# Patient Record
Sex: Female | Born: 1950 | Race: Black or African American | Hispanic: No | Smoking: Never smoker
Health system: Southern US, Community
[De-identification: ages and names within clinical notes are randomized; demographics above are authoritative.]

## PROBLEM LIST (undated history)

## (undated) DIAGNOSIS — I10 Essential (primary) hypertension: Secondary | ICD-10-CM

## (undated) DIAGNOSIS — E119 Type 2 diabetes mellitus without complications: Secondary | ICD-10-CM

## (undated) HISTORY — DX: Type 2 diabetes mellitus without complications: E11.9

## (undated) HISTORY — DX: Essential (primary) hypertension: I10

## (undated) HISTORY — PX: BACK SURGERY: SHX140

---

## 1997-05-14 DIAGNOSIS — E1129 Type 2 diabetes mellitus with other diabetic kidney complication: Secondary | ICD-10-CM | POA: Insufficient documentation

## 2004-02-23 ENCOUNTER — Other Ambulatory Visit: Admission: RE | Admit: 2004-02-23 | Discharge: 2004-02-23 | Payer: Self-pay | Admitting: Internal Medicine

## 2004-02-23 ENCOUNTER — Encounter: Payer: Self-pay | Admitting: Internal Medicine

## 2004-02-23 LAB — CONVERTED CEMR LAB: Pap Smear: NORMAL

## 2004-03-23 ENCOUNTER — Ambulatory Visit: Payer: Self-pay | Admitting: Internal Medicine

## 2004-07-18 ENCOUNTER — Ambulatory Visit: Payer: Self-pay

## 2004-09-07 ENCOUNTER — Ambulatory Visit: Payer: Self-pay | Admitting: Unknown Physician Specialty

## 2004-10-23 ENCOUNTER — Emergency Department: Payer: Self-pay | Admitting: Emergency Medicine

## 2004-10-24 ENCOUNTER — Emergency Department: Payer: Self-pay | Admitting: Emergency Medicine

## 2005-11-02 ENCOUNTER — Ambulatory Visit: Payer: Self-pay | Admitting: Internal Medicine

## 2007-01-01 ENCOUNTER — Encounter: Payer: Self-pay | Admitting: Internal Medicine

## 2007-01-01 DIAGNOSIS — I10 Essential (primary) hypertension: Secondary | ICD-10-CM | POA: Insufficient documentation

## 2011-12-20 ENCOUNTER — Emergency Department: Payer: Self-pay | Admitting: Emergency Medicine

## 2011-12-20 LAB — URINALYSIS, COMPLETE
Bacteria: NONE SEEN
Glucose,UR: NEGATIVE mg/dL (ref 0–75)
Nitrite: NEGATIVE
Specific Gravity: 1.021 (ref 1.003–1.030)
WBC UR: 2 /HPF (ref 0–5)

## 2014-05-25 DIAGNOSIS — E119 Type 2 diabetes mellitus without complications: Secondary | ICD-10-CM | POA: Insufficient documentation

## 2016-04-04 DIAGNOSIS — Z6841 Body Mass Index (BMI) 40.0 and over, adult: Secondary | ICD-10-CM | POA: Insufficient documentation

## 2017-05-27 ENCOUNTER — Other Ambulatory Visit: Payer: Self-pay | Admitting: Family Medicine

## 2017-05-27 DIAGNOSIS — Z0000 Encounter for general adult medical examination without abnormal findings: Secondary | ICD-10-CM

## 2017-05-27 DIAGNOSIS — Z13820 Encounter for screening for osteoporosis: Secondary | ICD-10-CM

## 2017-06-26 ENCOUNTER — Encounter: Payer: Self-pay | Admitting: Radiology

## 2017-06-26 ENCOUNTER — Ambulatory Visit
Admission: RE | Admit: 2017-06-26 | Discharge: 2017-06-26 | Disposition: A | Discharge status: Home / Self Care | Payer: Medicare PPO | Source: Ambulatory Visit | Attending: Family Medicine | Admitting: Family Medicine

## 2017-06-26 DIAGNOSIS — Z13820 Encounter for screening for osteoporosis: Secondary | ICD-10-CM | POA: Insufficient documentation

## 2017-06-26 DIAGNOSIS — M8588 Other specified disorders of bone density and structure, other site: Secondary | ICD-10-CM | POA: Diagnosis not present

## 2017-06-26 DIAGNOSIS — Z0000 Encounter for general adult medical examination without abnormal findings: Secondary | ICD-10-CM

## 2018-04-22 ENCOUNTER — Other Ambulatory Visit: Payer: Self-pay | Admitting: Family Medicine

## 2018-04-22 DIAGNOSIS — Z1231 Encounter for screening mammogram for malignant neoplasm of breast: Secondary | ICD-10-CM

## 2018-05-22 ENCOUNTER — Ambulatory Visit
Admission: RE | Admit: 2018-05-22 | Discharge: 2018-05-22 | Disposition: A | Discharge status: Home / Self Care | Payer: Medicare HMO | Source: Ambulatory Visit | Attending: Family Medicine | Admitting: Family Medicine

## 2018-05-22 DIAGNOSIS — Z1231 Encounter for screening mammogram for malignant neoplasm of breast: Secondary | ICD-10-CM | POA: Diagnosis present

## 2019-07-13 ENCOUNTER — Other Ambulatory Visit: Payer: Self-pay | Admitting: Family Medicine

## 2019-07-13 DIAGNOSIS — M8580 Other specified disorders of bone density and structure, unspecified site: Secondary | ICD-10-CM

## 2019-07-31 ENCOUNTER — Ambulatory Visit: Payer: Medicare HMO | Attending: Internal Medicine

## 2019-07-31 DIAGNOSIS — Z23 Encounter for immunization: Secondary | ICD-10-CM

## 2019-07-31 NOTE — Progress Notes (Signed)
   Covid-19 Vaccination Clinic  Name:  Elizabeth Blackwell    MRN: [id number] DOB: 02-16-1951  07/31/2019  [name] was observed post Covid-19 immunization for 15 minutes without incident. She was provided with Vaccine Information Sheet and instruction to access the V-Safe system.   [name] was instructed to call 911 with any severe reactions post vaccine: Marland Kitchen Difficulty breathing  . Swelling of face and throat  . A fast heartbeat  . A bad rash all over body  . Dizziness and weakness   Immunizations Administered    Name Date Dose VIS Date Route   Pfizer COVID-19 Vaccine 07/31/2019  3:31 PM 0.3 mL 04/24/2019 Intramuscular   Manufacturer: [employer], [employer]   Lot: [id number]   NDC: [address]-4

## 2019-08-24 ENCOUNTER — Ambulatory Visit: Payer: Medicare HMO | Attending: Internal Medicine

## 2019-08-25 ENCOUNTER — Ambulatory Visit: Payer: Medicare HMO | Attending: Internal Medicine

## 2019-08-25 DIAGNOSIS — Z23 Encounter for immunization: Secondary | ICD-10-CM

## 2019-08-25 NOTE — Progress Notes (Signed)
   Covid-19 Vaccination Clinic  Name:  Elizabeth Blackwell    MRN: [id number] DOB: 1951/03/17  08/25/2019  [name] was observed post Covid-19 immunization for 15 minutes without incident. She was provided with Vaccine Information Sheet and instruction to access the V-Safe system.   [name] was instructed to call 911 with any severe reactions post vaccine: Marland Kitchen Difficulty breathing  . Swelling of face and throat  . A fast heartbeat  . A bad rash all over body  . Dizziness and weakness   Immunizations Administered    Name Date Dose VIS Date Route   Pfizer COVID-19 Vaccine 08/25/2019 10:36 AM 0.3 mL 04/24/2019 Intramuscular   Manufacturer: [employer], [employer]   Lot: [id number]   NDC: [address]-1

## 2019-11-18 ENCOUNTER — Other Ambulatory Visit: Payer: Self-pay | Admitting: Family Medicine

## 2019-11-18 DIAGNOSIS — Z1231 Encounter for screening mammogram for malignant neoplasm of breast: Secondary | ICD-10-CM

## 2019-12-02 ENCOUNTER — Ambulatory Visit
Admission: RE | Admit: 2019-12-02 | Discharge: 2019-12-02 | Disposition: A | Discharge status: Home / Self Care | Payer: Medicare HMO | Source: Ambulatory Visit | Attending: Family Medicine | Admitting: Family Medicine

## 2019-12-02 DIAGNOSIS — Z1231 Encounter for screening mammogram for malignant neoplasm of breast: Secondary | ICD-10-CM | POA: Insufficient documentation

## 2020-02-01 ENCOUNTER — Ambulatory Visit
Admission: RE | Admit: 2020-02-01 | Discharge: 2020-02-01 | Disposition: A | Discharge status: Home / Self Care | Payer: Medicare HMO | Source: Ambulatory Visit | Attending: Family Medicine | Admitting: Family Medicine

## 2020-02-01 ENCOUNTER — Other Ambulatory Visit: Payer: Self-pay

## 2020-02-01 DIAGNOSIS — Z780 Asymptomatic menopausal state: Secondary | ICD-10-CM | POA: Insufficient documentation

## 2020-02-01 DIAGNOSIS — Z13820 Encounter for screening for osteoporosis: Secondary | ICD-10-CM | POA: Diagnosis not present

## 2020-02-01 DIAGNOSIS — M8580 Other specified disorders of bone density and structure, unspecified site: Secondary | ICD-10-CM

## 2020-04-19 ENCOUNTER — Other Ambulatory Visit: Payer: Self-pay | Admitting: Physician Assistant

## 2020-05-25 ENCOUNTER — Ambulatory Visit
Admission: RE | Admit: 2020-05-25 | Discharge: 2020-05-25 | Disposition: A | Discharge status: Home / Self Care | Payer: Medicare HMO | Source: Ambulatory Visit | Attending: Physician Assistant | Admitting: Physician Assistant

## 2020-05-25 ENCOUNTER — Other Ambulatory Visit: Payer: Self-pay | Admitting: Physician Assistant

## 2020-05-25 ENCOUNTER — Other Ambulatory Visit: Payer: Self-pay

## 2020-05-25 DIAGNOSIS — Z8616 Personal history of COVID-19: Secondary | ICD-10-CM

## 2020-05-25 DIAGNOSIS — J189 Pneumonia, unspecified organism: Secondary | ICD-10-CM | POA: Insufficient documentation

## 2020-05-25 DIAGNOSIS — U099 Post covid-19 condition, unspecified: Secondary | ICD-10-CM | POA: Insufficient documentation

## 2020-09-07 ENCOUNTER — Other Ambulatory Visit: Payer: Self-pay | Admitting: Physician Assistant

## 2020-09-07 DIAGNOSIS — Z1231 Encounter for screening mammogram for malignant neoplasm of breast: Secondary | ICD-10-CM

## 2020-12-05 ENCOUNTER — Other Ambulatory Visit: Payer: Self-pay

## 2020-12-05 ENCOUNTER — Ambulatory Visit
Admission: RE | Admit: 2020-12-05 | Discharge: 2020-12-05 | Disposition: A | Discharge status: Home / Self Care | Payer: Medicare HMO | Source: Ambulatory Visit | Attending: Physician Assistant | Admitting: Physician Assistant

## 2020-12-05 DIAGNOSIS — Z1231 Encounter for screening mammogram for malignant neoplasm of breast: Secondary | ICD-10-CM | POA: Insufficient documentation

## 2021-02-20 LAB — HEMOGLOBIN A1C: Hemoglobin A1C: 7.5

## 2021-05-31 ENCOUNTER — Ambulatory Visit: Payer: Self-pay

## 2021-05-31 NOTE — Telephone Encounter (Signed)
Called and explained to pt that Raynelle Fanning could not send anything in or see her for cough until she is established here as pt. Pt has a NP appt on 06/12/2021

## 2021-05-31 NOTE — Telephone Encounter (Signed)
The patient would like to be contacted about their cough when possible   The patient shares that they've experienced a cough since early January   The cough has caused soreness, nausea and discomfort   Please contact further when possible    Chief Complaint: Productive cough with yellow mucus Symptoms: Cough, wheezing, runny nose  Frequency: Started 05/16/21 Pertinent Negatives: Patient denies fever. Disposition: [] ED /[] Urgent Care (no appt availability in office) / [] Appointment(In office/virtual)/ []  [facility]/ [] Home Care/ [] Refused Recommended Disposition /[] [facility]/ []  Follow-up with PCP Additional Notes: Pt. Has an a new pt. Appointment 06/12/21. Asking of she can be seen sooner. Monday "is good day for me." Please advise pt.   Answer Assessment - Initial Assessment Questions 1. ONSET: "When did the cough begin?"      05/16/21 2. SEVERITY: "How bad is the cough today?"      Severe 3. SPUTUM: "Describe the color of your sputum" (none, dry cough; clear, white, yellow, green)     Yellow 4. HEMOPTYSIS: "Are you coughing up any blood?" If so ask: "How much?" (flecks, streaks, tablespoons, etc.)     No 5. DIFFICULTY BREATHING: "Are you having difficulty breathing?" If Yes, ask: "How bad is it?" (e.g., mild, moderate, severe)    - MILD: No SOB at rest, mild SOB with walking, speaks normally in sentences, can lie down, no retractions, pulse < 100.    - MODERATE: SOB at rest, SOB with minimal exertion and prefers to sit, cannot lie down flat, speaks in phrases, mild retractions, audible wheezing, pulse 100-120.    - SEVERE: Very SOB at rest, speaks in single words, struggling to breathe, sitting hunched forward, retractions, pulse > 120      Mild when coughing 6. FEVER: "Do you have a fever?" If Yes, ask: "What is your temperature, how was it measured, and when did it start?"     No 7. CARDIAC HISTORY: "Do you have any history of heart disease?" (e.g., heart  attack, congestive heart failure)      No 8. LUNG HISTORY: "Do you have any history of lung disease?"  (e.g., pulmonary embolus, asthma, emphysema)     No 9. PE RISK FACTORS: "Do you have a history of blood clots?" (or: recent major surgery, recent prolonged travel, bedridden)     No 10. OTHER SYMPTOMS: "Do you have any other symptoms?" (e.g., runny nose, wheezing, chest pain)       Wheezing, runny nose 11. PREGNANCY: "Is there any chance you are pregnant?" "When was your last menstrual period?"       No 12. TRAVEL: "Have you traveled out of the country in the last month?" (e.g., travel history, exposures)       No  Protocols used: Cough - Acute Productive-A-AH

## 2021-06-12 ENCOUNTER — Ambulatory Visit
Admission: RE | Admit: 2021-06-12 | Discharge: 2021-06-12 | Disposition: A | Discharge status: Home / Self Care | Payer: Medicare HMO | Source: Ambulatory Visit | Attending: Nurse Practitioner | Admitting: Nurse Practitioner

## 2021-06-12 ENCOUNTER — Encounter: Payer: Self-pay | Admitting: Nurse Practitioner

## 2021-06-12 ENCOUNTER — Ambulatory Visit
Admission: RE | Admit: 2021-06-12 | Discharge: 2021-06-12 | Disposition: A | Discharge status: Home / Self Care | Payer: Medicare HMO | Attending: Pediatrics | Admitting: Pediatrics

## 2021-06-12 ENCOUNTER — Other Ambulatory Visit: Payer: Self-pay

## 2021-06-12 ENCOUNTER — Ambulatory Visit (INDEPENDENT_AMBULATORY_CARE_PROVIDER_SITE_OTHER): Payer: Medicare HMO | Admitting: Nurse Practitioner

## 2021-06-12 VITALS — BP 138/86 | HR 97 | Temp 97.7°F | Resp 16 | Ht 60.0 in | Wt 214.2 lb

## 2021-06-12 DIAGNOSIS — R059 Cough, unspecified: Secondary | ICD-10-CM

## 2021-06-12 DIAGNOSIS — Z6841 Body Mass Index (BMI) 40.0 and over, adult: Secondary | ICD-10-CM

## 2021-06-12 DIAGNOSIS — R0602 Shortness of breath: Secondary | ICD-10-CM | POA: Diagnosis present

## 2021-06-12 DIAGNOSIS — Z7689 Persons encountering health services in other specified circumstances: Secondary | ICD-10-CM | POA: Diagnosis not present

## 2021-06-12 DIAGNOSIS — E119 Type 2 diabetes mellitus without complications: Secondary | ICD-10-CM | POA: Diagnosis not present

## 2021-06-12 DIAGNOSIS — I10 Essential (primary) hypertension: Secondary | ICD-10-CM | POA: Diagnosis not present

## 2021-06-12 MED ORDER — ACCU-CHEK AVIVA PLUS VI STRP: ORAL_STRIP | 12 refills | Status: DC

## 2021-06-12 MED ORDER — ALBUTEROL SULFATE HFA 108 (90 BASE) MCG/ACT IN AERS: 2.0000 | INHALATION_SPRAY | Freq: Four times a day (QID) | RESPIRATORY_TRACT | 0 refills | Status: DC | PRN

## 2021-06-12 NOTE — Progress Notes (Signed)
Pulse 97    Temp 97.7 F (36.5 C) (Oral)    Resp 16    Ht 5' (1.524 m)    Wt 214 lb 3.2 oz (97.2 kg)    SpO2 98%    BMI 41.83 kg/m    Subjective:    Patient ID: Elizabeth Blackwell, female    DOB: 1951-03-25, 71 y.o.   MRN: [id number]  HPI: Elizabeth Blackwell is a 71 y.o. female, here alone  Chief Complaint  Patient presents with   Establish Care   Cough    On and off for 1 month    Establish care: She says it has been a long time. She has been getting care at [facility]. She says she recently had blood work, requesting records.   HTN: She does not take her blood pressure at home. Her blood pressure today was  138/86 .  She is currently taking lisinopril 40 mg daily.  She denies any chest pain , headaches or blurred vision.   DM2: She does check her blood sugar every once in awhile.  She is currently taking linagliptin 5 mg daily.  Does not tolerate metformin. Diabetic eye exam done at [facility], two months ago.  Foot exam up to date done at [facility] within the last year will get records. She denies any polyuria, polydipsia, or polyphagia.   Obesity: Her current weight is 214 lbs.  She says it has been hard eating healthy with her cough.  She says the cough drops have a lot of sugar in them.  She says everyone is buying up the sugar free cough drops.   Cough: She said about a month ago she had a URI.  She says she has been coughing since.  She says she went to the doctor she has been taking mucinex, tussinex, tessalon perls and albuterol. She says she has been using cough drops and hot totties. She says that the cough has improved but she still coughing.  She says she did feel feverish about a week ago.  She says that she can get short of breath after a coughing fit. Will get chest xray. She would like a refill on her albuterol inhaler. Also, gave her a armonair digihaler sample to do twice a day.   Relevant past medical, surgical, family and social history  reviewed and updated as indicated. Interim medical history since our last visit reviewed. Allergies and medications reviewed and updated.  Review of Systems  Constitutional: Positive for fever, negative for weight change.  Respiratory: Positive for cough and shortness of breath.   Cardiovascular: Negative for chest pain or palpitations.  Gastrointestinal: Negative for abdominal pain, no bowel changes.  Musculoskeletal: Negative for gait problem or joint swelling.  Skin: Negative for rash.  Neurological: Negative for dizziness or headache.  No other specific complaints in a complete review of systems (except as listed in HPI above).      Objective:    Pulse 97    Temp 97.7 F (36.5 C) (Oral)    Resp 16    Ht 5' (1.524 m)    Wt 214 lb 3.2 oz (97.2 kg)    SpO2 98%    BMI 41.83 kg/m   Wt Readings from Last 3 Encounters:  06/12/21 214 lb 3.2 oz (97.2 kg)    Physical Exam  Constitutional: Patient appears well-developed and well-nourished. Obese  No distress.  HEENT: head atraumatic, normocephalic, pupils equal and reactive to light, neck supple  Cardiovascular: Normal rate, regular rhythm and normal heart sounds.  No murmur heard. No BLE edema. Pulmonary/Chest: Effort normal and breath sounds normal. No respiratory distress. Abdominal: Soft.  There is no tenderness. Psychiatric: Patient has a normal mood and affect. behavior is normal. Judgment and thought content normal.   Results for orders placed or performed in visit on 02/23/04  Converted [facility] Lab  Result Value Ref Range   Pap Smear Normal       Assessment & Plan:   1. Essential hypertension -continue current treatment  2. Controlled type 2 diabetes mellitus without complication, without long-term current use of insulin (HCC)  - glucose blood (ACCU-CHEK AVIVA PLUS) test strip; Use as instructed  Dispense: 100 each; Refill: 12  3. Morbid obesity with BMI of 40.0-44.9, adult (HCC) -increase physical activity as  tolerated -monitor sweets  4. Encounter to establish care -get records from previous provider  5. Cough, unspecified type  - DG Chest 2 View; Future - albuterol (VENTOLIN HFA) 108 (90 Base) MCG/ACT inhaler; Inhale 2 puffs into the lungs every 6 (six) hours as needed for wheezing or shortness of breath.  Dispense: 8 g; Refill: 0  6. Shortness of breath  - DG Chest 2 View; Future - albuterol (VENTOLIN HFA) 108 (90 Base) MCG/ACT inhaler; Inhale 2 puffs into the lungs every 6 (six) hours as needed for wheezing or shortness of breath.  Dispense: 8 g; Refill: 0     Follow up plan: Return in about 6 months (around 12/10/2021) for cpe.

## 2021-06-13 ENCOUNTER — Encounter: Payer: Self-pay | Admitting: Nurse Practitioner

## 2021-06-13 ENCOUNTER — Other Ambulatory Visit: Payer: Self-pay | Admitting: Nurse Practitioner

## 2021-06-14 ENCOUNTER — Telehealth: Payer: Self-pay | Admitting: Nurse Practitioner

## 2021-06-14 DIAGNOSIS — E119 Type 2 diabetes mellitus without complications: Secondary | ICD-10-CM

## 2021-06-14 NOTE — Telephone Encounter (Signed)
[facility] called, phone rings, no answer. Will try back later.

## 2021-06-14 NOTE — Telephone Encounter (Signed)
Copied from CRM [id number]. Topic: Quick Communication - Rx Refill/Question >> Jun 14, 2021 10:30 AM Gaetana Michaelis A wrote: Medication: glucose blood (ACCU-CHEK AVIVA PLUS) test strip [[id number]]   Has the patient contacted their pharmacy? Yes.  The patient has been directed to contact their PCP (Agent: If no, request that the patient contact the pharmacy for the refill. If patient does not wish to contact the pharmacy document the reason why and proceed with request.) (Agent: If yes, when and what did the pharmacy advise?)  Preferred Pharmacy (with phone number or street name): [facility] [address] [place] - [address] [place] [address] Phone: [phone] Fax: [phone]  Has the patient been seen for an appointment in the last year OR does the patient have an upcoming appointment? Yes.    Agent: Please be advised that RX refills may take up to 3 business days. We ask that you follow-up with your pharmacy.

## 2021-06-14 NOTE — Telephone Encounter (Signed)
[facility] called and spoke to [place], [facility] about the refill(s) Aviva Plus Test Strips requested. Advised it was sent on 06/12/21 #100/12 refill(s). She's on Medicare A/B and they need specific directions on how often to check the blood sugar. Advised I will send to the provider.

## 2021-06-15 ENCOUNTER — Other Ambulatory Visit: Payer: Self-pay | Admitting: Emergency Medicine

## 2021-06-15 DIAGNOSIS — E119 Type 2 diabetes mellitus without complications: Secondary | ICD-10-CM

## 2021-06-15 MED ORDER — ACCU-CHEK AVIVA PLUS VI STRP: ORAL_STRIP | 12 refills | Status: DC

## 2021-06-15 NOTE — Telephone Encounter (Signed)
New script sent with Dx code and directions

## 2021-06-20 ENCOUNTER — Other Ambulatory Visit: Payer: Self-pay | Admitting: Nurse Practitioner

## 2021-06-20 ENCOUNTER — Other Ambulatory Visit: Payer: Self-pay | Admitting: Emergency Medicine

## 2021-06-20 ENCOUNTER — Telehealth: Payer: Self-pay

## 2021-06-20 DIAGNOSIS — E119 Type 2 diabetes mellitus without complications: Secondary | ICD-10-CM

## 2021-06-20 DIAGNOSIS — E669 Obesity, unspecified: Secondary | ICD-10-CM | POA: Insufficient documentation

## 2021-06-20 DIAGNOSIS — E1169 Type 2 diabetes mellitus with other specified complication: Secondary | ICD-10-CM | POA: Insufficient documentation

## 2021-06-20 MED ORDER — ACCU-CHEK AVIVA PLUS VI STRP: ORAL_STRIP | 12 refills | Status: DC

## 2021-06-20 NOTE — Telephone Encounter (Signed)
[facility] called to get a new Rx sent with directions/ can not be covered with no specific directions / please advise about glucose blood (ACCU-CHEK AVIVA PLUS) test strip

## 2021-06-20 NOTE — Telephone Encounter (Signed)
Order sent.

## 2021-06-20 NOTE — Telephone Encounter (Signed)
Copied from CRM [id number]. Topic: Quick Communication - Rx Refill/Question >> Jun 20, 2021 10:33 AM Pawlus, Maxine Glenn A wrote: Pt stated her insurance wont pay for her Rx without more detailed instructions, "Use as instructed" is not enough information for her insurance to pay for glucose blood (ACCU-CHEK AVIVA PLUS) test strip, please advise.

## 2021-06-21 ENCOUNTER — Telehealth: Payer: Self-pay

## 2021-06-21 NOTE — Telephone Encounter (Signed)
Copied from CRM [id number]. Topic: General - Other >> Jun 21, 2021 10:49 AM Elizabeth Blackwell wrote: Reason for CRM: The patient has called to request an update on their previous rx issues   The patient has been told by their pharmacy that they need additional directions with their glucose blood (ACCU-CHEK AVIVA PLUS) test strip [[id number]]  prescription so that it can be covered by their insurance  "Use as directed" is not an accepted direction by the patient's insurance and coverage of the test strips is unlikely without clarification   The patient would like to speak with Blackwell member of clinical staff when possible about this matter  Please contact further

## 2021-06-21 NOTE — Telephone Encounter (Signed)
[facility] this morning

## 2021-06-22 ENCOUNTER — Other Ambulatory Visit: Payer: Self-pay | Admitting: Emergency Medicine

## 2021-06-22 DIAGNOSIS — E119 Type 2 diabetes mellitus without complications: Secondary | ICD-10-CM

## 2021-06-22 MED ORDER — ACCU-CHEK AVIVA PLUS VI STRP: ORAL_STRIP | 12 refills | Status: DC

## 2021-07-27 ENCOUNTER — Other Ambulatory Visit: Payer: Self-pay | Admitting: Nurse Practitioner

## 2021-07-27 NOTE — Telephone Encounter (Signed)
Requested medication (s) are due for refill today - unsure ? ?Requested medication (s) are on the active medication list -yes ? ?Future visit scheduled -no ? ?Last refill: 06/12/21 ? ?Notes to clinic: Request RF: Rx for acute problem- request for RF sent for review.  ? ?Requested Prescriptions  ?Pending Prescriptions Disp Refills  ? albuterol (VENTOLIN HFA) 108 (90 Base) MCG/ACT inhaler [Pharmacy Med Name: Albuterol Sulfate HFA 108 (90 Base) MCG/ACT Inhalation Aerosol Solution] 9 g 0  ?  Sig: INHALE 2 PUFFS BY MOUTH EVERY 6 HOURS AS NEEDED FOR WHEEZING AND FOR SHORTNESS OF BREATH  ?  ? Pulmonology:  Beta Agonists 2 Passed - 07/27/2021  1:54 PM  ?  ?  Passed - Last BP in normal range  ?  BP Readings from Last 1 Encounters:  ?06/12/21 138/86  ?  ?  ?  ?  Passed - Last Heart Rate in normal range  ?  Pulse Readings from Last 1 Encounters:  ?06/12/21 97  ?  ?  ?  ?  Passed - Valid encounter within last 12 months  ?  Recent Outpatient Visits   ? ?      ? 1 month ago Essential hypertension  ? [facility] Berniece Salines, FNP  ? ?  ?  ?Future Appointments   ? ?        ? In 4 months Zane Herald, Rudolpho Sevin, FNP [facility]  ? ?  ? ?  ?  ?  ? ? ? ?Requested Prescriptions  ?Pending Prescriptions Disp Refills  ? albuterol (VENTOLIN HFA) 108 (90 Base) MCG/ACT inhaler [Pharmacy Med Name: Albuterol Sulfate HFA 108 (90 Base) MCG/ACT Inhalation Aerosol Solution] 9 g 0  ?  Sig: INHALE 2 PUFFS BY MOUTH EVERY 6 HOURS AS NEEDED FOR WHEEZING AND FOR SHORTNESS OF BREATH  ?  ? Pulmonology:  Beta Agonists 2 Passed - 07/27/2021  1:54 PM  ?  ?  Passed - Last BP in normal range  ?  BP Readings from Last 1 Encounters:  ?06/12/21 138/86  ?  ?  ?  ?  Passed - Last Heart Rate in normal range  ?  Pulse Readings from Last 1 Encounters:  ?06/12/21 97  ?  ?  ?  ?  Passed - Valid encounter within last 12 months  ?  Recent Outpatient Visits   ? ?      ? 1 month ago Essential hypertension  ? [facility] Berniece Salines, FNP  ? ?  ?  ?Future Appointments   ? ?        ? In 4 months Zane Herald, Rudolpho Sevin, FNP [facility]  ? ?  ? ?  ?  ?  ? ? ? ?

## 2021-07-31 ENCOUNTER — Other Ambulatory Visit: Payer: Self-pay

## 2021-07-31 DIAGNOSIS — R0602 Shortness of breath: Secondary | ICD-10-CM

## 2021-07-31 DIAGNOSIS — R059 Cough, unspecified: Secondary | ICD-10-CM

## 2021-08-01 ENCOUNTER — Ambulatory Visit (INDEPENDENT_AMBULATORY_CARE_PROVIDER_SITE_OTHER): Payer: Medicare HMO

## 2021-08-01 ENCOUNTER — Telehealth: Payer: Self-pay

## 2021-08-01 DIAGNOSIS — Z1231 Encounter for screening mammogram for malignant neoplasm of breast: Secondary | ICD-10-CM | POA: Diagnosis not present

## 2021-08-01 DIAGNOSIS — Z780 Asymptomatic menopausal state: Secondary | ICD-10-CM | POA: Diagnosis not present

## 2021-08-01 DIAGNOSIS — Z1211 Encounter for screening for malignant neoplasm of colon: Secondary | ICD-10-CM

## 2021-08-01 DIAGNOSIS — Z0000 Encounter for general adult medical examination without abnormal findings: Secondary | ICD-10-CM

## 2021-08-01 NOTE — Progress Notes (Signed)
? ?Subjective:  ? Elizabeth Blackwell is a 71 y.o. female who presents for an Initial Medicare Annual Wellness Visit. ? ?Virtual Visit via Telephone Note ? ?I connected with  Elizabeth Blackwell on 08/01/21 at  1:30 PM EDT by telephone and verified that I am speaking with the correct person using two identifiers. ? ?Location: ?Patient: home ?Provider: [place] ?Persons participating in the virtual visit: patient/Nurse Health Advisor ?  ?I discussed the limitations, risks, security and privacy concerns of performing an evaluation and management service by telephone and the availability of in person appointments. The patient expressed understanding and agreed to proceed. ? ?Interactive audio and video telecommunications were attempted between this nurse and patient, however failed, due to patient having technical difficulties OR patient did not have access to video capability.  We continued and completed visit with audio only. ? ?Some vital signs may be absent or patient reported.  ? ?Clemetine Marker, LPN ? ? ?Review of Systems    ? ?Cardiac Risk Factors include: advanced age (>28men, >4 women);diabetes mellitus;hypertension ? ?   ?Objective:  ?  ?There were no vitals filed for this visit. ?There is no height or weight on file to calculate BMI. ? ?Advanced Directives 08/01/2021  ?Does Patient Have a Medical Advance Directive? No  ?Would patient like information on creating a medical advance directive? No - Patient declined  ? ? ?Current Medications (verified) ?Outpatient Encounter Medications as of 08/01/2021  ?Medication Sig  ? Accu-Chek Softclix Lancets lancets   ? albuterol (VENTOLIN HFA) 108 (90 Base) MCG/ACT inhaler INHALE 2 PUFFS BY MOUTH EVERY 6 HOURS AS NEEDED FOR WHEEZING AND FOR SHORTNESS OF BREATH  ? glucose blood (ACCU-CHEK AVIVA PLUS) test strip Test Blood sugar twice a day. Dx. E11.69  ? linagliptin (TRADJENTA) 5 MG TABS tablet   ? lisinopril (ZESTRIL) 40 MG tablet Take 40 mg by mouth daily.  ? melatonin 5 MG TABS  Take 10 mg by mouth at bedtime.  ? ?No facility-administered encounter medications on file as of 08/01/2021.  ? ? ?Allergies (verified) ?Codeine phosphate  ? ?History: ?Past Medical History:  ?Diagnosis Date  ? Diabetes mellitus without complication ([place])   ? Hypertension   ? ?Past Surgical History:  ?Procedure Laterality Date  ? BACK SURGERY    ? ?History reviewed. No pertinent family history. ?Social History  ? ?Socioeconomic History  ? Marital status: Divorced  ?  Spouse name: Not on file  ? Number of children: 2  ? Years of education: Not on file  ? Highest education level: Not on file  ?Occupational History  ? Not on file  ?Tobacco Use  ? Smoking status: Never  ? Smokeless tobacco: Never  ?Vaping Use  ? Vaping Use: Never used  ?Substance and Sexual Activity  ? Alcohol use: Yes  ?  Alcohol/week: 1.0 standard drink  ?  Types: 1 Cans of beer per week  ? Drug use: Never  ? Sexual activity: Not Currently  ?Other Topics Concern  ? Not on file  ?Social History Narrative  ? Pt lives alone  ? ?Social Determinants of Health  ? ?Financial Resource Strain: Low Risk   ? Difficulty of Paying Living Expenses: Not hard at all  ?Food Insecurity: No Food Insecurity  ? Worried About Charity fundraiser in the Last Year: Never true  ? Ran Out of Food in the Last Year: Never true  ?Transportation Needs: No Transportation Needs  ? Lack of Transportation (Medical): No  ? Lack of  Transportation (Non-Medical): No  ?Physical Activity: Inactive  ? Days of Exercise per Week: 0 days  ? Minutes of Exercise per Session: 0 min  ?Stress: No Stress Concern Present  ? Feeling of Stress : Not at all  ?Social Connections: Moderately Isolated  ? Frequency of Communication with Friends and Family: More than three times a week  ? Frequency of Social Gatherings with Friends and Family: More than three times a week  ? Attends Religious Services: More than 4 times per year  ? Active Member of Clubs or Organizations: No  ? Attends Archivist  Meetings: Never  ? Marital Status: Divorced  ? ? ?Tobacco Counseling ?Counseling given: Not Answered ? ? ?Clinical Intake: ? ?Pre-visit preparation completed: Yes ? ?Pain : No/denies pain ? ?  ? ?Nutritional Risks: None ?Diabetes: Yes ?CBG done?: No ?Did pt. bring in CBG monitor from home?: No ? ?How often do you need to have someone help you when you read instructions, pamphlets, or other written materials from your doctor or pharmacy?: 1 - Never ? ?Nutrition Risk Assessment: ? ?Has the patient had any N/V/D within the last 2 months?  No  ?Does the patient have any non-healing wounds?  No  ?Has the patient had any unintentional weight loss or weight gain?  No  ? ?Diabetes: ? ?Is the patient diabetic?  Yes  ?If diabetic, was a CBG obtained today?  No  ?Did the patient bring in their glucometer from home?  No  ?How often do you monitor your CBG's? daily.  ? ?Financial Strains and Diabetes Management: ? ?Are you having any financial strains with the device, your supplies or your medication? No .  ?Does the patient want to be seen by Chronic Care Management for management of their diabetes?  No  ?Would the patient like to be referred to a Nutritionist or for Diabetic Management?  No  ? ?Diabetic Exams: ? ?Diabetic Eye Exam: Completed per patient; need records from [facility].  ? ?Diabetic Foot Exam: Pt has been advised about the importance in completing this exam. Pt is scheduled for diabetic foot exam on 12/08/21; pt also sees podiatry.  ? ? ?Interpreter Needed?: No ? ?Information entered by :: Clemetine Marker LPN ? ? ?Activities of Daily Living ?In your present state of health, do you have any difficulty performing the following activities: 08/01/2021 06/12/2021  ?Hearing? N N  ?Vision? N Y  ?Difficulty concentrating or making decisions? N N  ?Walking or climbing stairs? N N  ?Dressing or bathing? N N  ?Doing errands, shopping? N N  ?Preparing Food and eating ? N -  ?Using the Toilet? N -  ?In the past six months,  have you accidently leaked urine? N -  ?Do you have problems with loss of bowel control? N -  ?Managing your Medications? N -  ?Managing your Finances? N -  ?Housekeeping or managing your Housekeeping? N -  ?Some recent data might be hidden  ? ? ?Patient Care Team: ?Bo Merino, FNP as PCP - General (Nurse Practitioner) ?Sharlotte Alamo, DPM (Podiatry) ?Estill Cotta, MD (Ophthalmology) ? ?Indicate any recent Medical Services you may have received from other than [facility] providers in the past year (date may be approximate). ? ?   ?Assessment:  ? This is a routine wellness examination for [place]. ? ?Hearing/Vision screen ?Hearing Screening - Comments:: Pt denies hearing difficulty ? ?Vision Screening - Comments:: Annual vision screenings done at [facility]  ? ?Dietary issues  and exercise activities discussed: ?Current Exercise Habits: The patient does not participate in regular exercise at present, Exercise limited by: None identified ? ? Goals Addressed   ? ?  ?  ?  ?  ? This Visit's Progress  ?  Patient Stated     ?  Pt would like to reduce sugar intake ?  ? ?  ? ?Depression Screen ?PHQ 2/9 Scores 08/01/2021 06/12/2021  ?PHQ - 2 Score 0 0  ?PHQ- 9 Score 1 0  ?  ?Fall Risk ?Fall Risk  08/01/2021 06/12/2021  ?Falls in the past year? 0 0  ?Number falls in past yr: 0 0  ?Injury with Fall? 0 0  ?Risk for fall due to : No Fall Risks -  ?Follow up Falls prevention discussed Falls evaluation completed  ? ? ?FALL RISK PREVENTION PERTAINING TO THE HOME: ? ?Any stairs in or around the home? No  ?If so, are there any without handrails? No  ?Home free of loose throw rugs in walkways, pet beds, electrical cords, etc? Yes  ?Adequate lighting in your home to reduce risk of falls? Yes  ? ?ASSISTIVE DEVICES UTILIZED TO PREVENT FALLS: ? ?Life alert? No  ?Use of a cane, walker or w/c? No  ?Grab bars in the bathroom? Yes  ?Shower chair or bench in shower? No  ?Elevated toilet seat or a handicapped toilet? Yes  ? ?TIMED UP AND  GO: ? ?Was the test performed? No . Telephonic visit ? ?Cognitive Function: Normal cognitive status assessed by direct observation by this Nurse Health Advisor. No abnormalities found.  ? ?  ?  ?  ? ?Im

## 2021-08-01 NOTE — Patient Instructions (Signed)
Elizabeth Blackwell , ?Thank you for taking time to come for your Medicare Wellness Visit. I appreciate your ongoing commitment to your health goals. Please review the following plan we discussed and let me know if I can assist you in the future.  ? ?Screening recommendations/referrals: ?Colonoscopy: Referral sent to [facility] today for repeat screening colonoscopy. They will contact you for an appointment.  ?Mammogram: done 12/05/20. Please call [phone] to schedule your mammogram and bone density screening ?Bone Density: done 02/01/20 ?Recommended yearly ophthalmology/optometry visit for glaucoma screening and checkup ?Recommended yearly dental visit for hygiene and checkup ? ?Vaccinations: ?Influenza vaccine: done 03/08/21 ?Pneumococcal vaccine: due ?Tdap vaccine: due ?Shingles vaccine: Shingrix discussed. Please contact your pharmacy for coverage information.  ?Covid-19: done 07/31/19, 08/25/19 & 06/18/20 ? ?Advanced directives: Please bring a copy of your health care power of attorney and living will to the office at your convenience once you have completed those documents.  ? ?Conditions/risks identified: Recommend healthy eating and reducing sugar intake.  ? ?Next appointment: Follow up in one year for your annual wellness visit  ? ? ?Preventive Care 7 Years and Older, Female ?Preventive care refers to lifestyle choices and visits with your health care provider that can promote health and wellness. ?What does preventive care include? ?A yearly physical exam. This is also called an annual well check. ?Dental exams once or twice a year. ?Routine eye exams. Ask your health care provider how often you should have your eyes checked. ?Personal lifestyle choices, including: ?Daily care of your teeth and gums. ?Regular physical activity. ?Eating a healthy diet. ?Avoiding tobacco and drug use. ?Limiting alcohol use. ?Practicing safe sex. ?Taking low-dose aspirin every day. ?Taking vitamin and mineral  supplements as recommended by your health care provider. ?What happens during an annual well check? ?The services and screenings done by your health care provider during your annual well check will depend on your age, overall health, lifestyle risk factors, and family history of disease. ?Counseling  ?Your health care provider may ask you questions about your: ?Alcohol use. ?Tobacco use. ?Drug use. ?Emotional well-being. ?Home and relationship well-being. ?Sexual activity. ?Eating habits. ?History of falls. ?Memory and ability to understand (cognition). ?Work and work Astronomer. ?Reproductive health. ?Screening  ?You may have the following tests or measurements: ?Height, weight, and BMI. ?Blood pressure. ?Lipid and cholesterol levels. These may be checked every 5 years, or more frequently if you are over 62 years old. ?Skin check. ?Lung cancer screening. You may have this screening every year starting at age 92 if you have a 30-pack-year history of smoking and currently smoke or have quit within the past 15 years. ?Fecal occult blood test (FOBT) of the stool. You may have this test every year starting at age 55. ?Flexible sigmoidoscopy or colonoscopy. You may have a sigmoidoscopy every 5 years or a colonoscopy every 10 years starting at age 39. ?Hepatitis C blood test. ?Hepatitis B blood test. ?Sexually transmitted disease (STD) testing. ?Diabetes screening. This is done by checking your blood sugar (glucose) after you have not eaten for a while (fasting). You may have this done every 1-3 years. ?Bone density scan. This is done to screen for osteoporosis. You may have this done starting at age 60. ?Mammogram. This may be done every 1-2 years. Talk to your health care provider about how often you should have regular mammograms. ?Talk with your health care provider about your test results, treatment options, and if necessary, the need for more tests. ?Vaccines  ?Your health  care provider may recommend certain  vaccines, such as: ?Influenza vaccine. This is recommended every year. ?Tetanus, diphtheria, and acellular pertussis (Tdap, Td) vaccine. You may need a Td booster every 10 years. ?Zoster vaccine. You may need this after age 70. ?Pneumococcal 13-valent conjugate (PCV13) vaccine. One dose is recommended after age 70. ?Pneumococcal polysaccharide (PPSV23) vaccine. One dose is recommended after age 70. ?Talk to your health care provider about which screenings and vaccines you need and how often you need them. ?This information is not intended to replace advice given to you by your health care provider. Make sure you discuss any questions you have with your health care provider. ?Document Released: 05/27/2015 Document Revised: 01/18/2016 Document Reviewed: 03/01/2015 ?Elsevier Interactive Patient Education ? 2017 [employer] ? ?Fall Prevention in the Home ?Falls can cause injuries. They can happen to people of all ages. There are many things you can do to make your home safe and to help prevent falls. ?What can I do on the outside of my home? ?Regularly fix the edges of walkways and driveways and fix any cracks. ?Remove anything that might make you trip as you walk through a door, such as a raised step or threshold. ?Trim any bushes or trees on the path to your home. ?Use bright outdoor lighting. ?Clear any walking paths of anything that might make someone trip, such as rocks or tools. ?Regularly check to see if handrails are loose or broken. Make sure that both sides of any steps have handrails. ?Any raised decks and porches should have guardrails on the edges. ?Have any leaves, snow, or ice cleared regularly. ?Use sand or salt on walking paths during winter. ?Clean up any spills in your garage right away. This includes oil or grease spills. ?What can I do in the bathroom? ?Use night lights. ?Install grab bars by the toilet and in the tub and shower. Do not use towel bars as grab bars. ?Use non-skid mats or decals in  the tub or shower. ?If you need to sit down in the shower, use a plastic, non-slip stool. ?Keep the floor dry. Clean up any water that spills on the floor as soon as it happens. ?Remove soap buildup in the tub or shower regularly. ?Attach bath mats securely with double-sided non-slip rug tape. ?Do not have throw rugs and other things on the floor that can make you trip. ?What can I do in the bedroom? ?Use night lights. ?Make sure that you have a light by your bed that is easy to reach. ?Do not use any sheets or blankets that are too big for your bed. They should not hang down onto the floor. ?Have a firm chair that has side arms. You can use this for support while you get dressed. ?Do not have throw rugs and other things on the floor that can make you trip. ?What can I do in the kitchen? ?Clean up any spills right away. ?Avoid walking on wet floors. ?Keep items that you use a lot in easy-to-reach places. ?If you need to reach something above you, use a strong step stool that has a grab bar. ?Keep electrical cords out of the way. ?Do not use floor polish or wax that makes floors slippery. If you must use wax, use non-skid floor wax. ?Do not have throw rugs and other things on the floor that can make you trip. ?What can I do with my stairs? ?Do not leave any items on the stairs. ?Make sure that there are handrails on  both sides of the stairs and use them. Fix handrails that are broken or loose. Make sure that handrails are as long as the stairways. ?Check any carpeting to make sure that it is firmly attached to the stairs. Fix any carpet that is loose or worn. ?Avoid having throw rugs at the top or bottom of the stairs. If you do have throw rugs, attach them to the floor with carpet tape. ?Make sure that you have a light switch at the top of the stairs and the bottom of the stairs. If you do not have them, ask someone to add them for you. ?What else can I do to help prevent falls? ?Wear shoes that: ?Do not have high  heels. ?Have rubber bottoms. ?Are comfortable and fit you well. ?Are closed at the toe. Do not wear sandals. ?If you use a stepladder: ?Make sure that it is fully opened. Do not climb a closed stepladder. ?Make sure t

## 2021-08-01 NOTE — Telephone Encounter (Signed)
CALLED PATIENT NO ANSWER LEFT VOICEMAIL FOR A CALL BACK ? ?

## 2021-08-03 ENCOUNTER — Other Ambulatory Visit: Payer: Self-pay

## 2021-08-03 DIAGNOSIS — Z86010 Personal history of colonic polyps: Secondary | ICD-10-CM

## 2021-08-03 MED ORDER — CLENPIQ 10-3.5-12 MG-GM -GM/160ML PO SOLN: 1.0000 | ORAL | 0 refills | Status: DC

## 2021-08-03 MED ORDER — PEG 3350-KCL-NA BICARB-NACL 420 G PO SOLR: 4000.0000 mL | Freq: Once | ORAL | 0 refills | Status: AC

## 2021-08-03 NOTE — Progress Notes (Signed)
Gastroenterology Pre-Procedure Review ? ?Request Date: 09/18/2021 ?Requesting Physician: Dr. Allegra Lai ? ? ?PATIENT REVIEW QUESTIONS: The patient responded to the following health history questions as indicated:   ? ?1. Are you having any GI issues? no ?2. Do you have a personal history of Polyps? yes (last colonoscopy) ?3. Do you have a family history of Colon Cancer or Polyps? yes (colon  cancer) ?4. Diabetes Mellitus? yes (type 2 ) ?5. Joint replacements in the past 12 months?no ?6. Major health problems in the past 3 months?no ?7. Any artificial heart valves, MVP, or defibrillator?no ?   ?MEDICATIONS & ALLERGIES:    ?Patient reports the following regarding taking any anticoagulation/antiplatelet therapy:   ?Plavix, Coumadin, Eliquis, Xarelto, Lovenox, Pradaxa, Brilinta, or Effient? no ?Aspirin? no ? ?Patient confirms/reports the following medications:  ?Current Outpatient Medications  ?Medication Sig Dispense Refill  ? Accu-Chek Softclix Lancets lancets     ? albuterol (VENTOLIN HFA) 108 (90 Base) MCG/ACT inhaler INHALE 2 PUFFS BY MOUTH EVERY 6 HOURS AS NEEDED FOR WHEEZING AND FOR SHORTNESS OF BREATH 9 g 0  ? glucose blood (ACCU-CHEK AVIVA PLUS) test strip Test Blood sugar twice a day. Dx. E11.69 100 each 12  ? linagliptin (TRADJENTA) 5 MG TABS tablet     ? lisinopril (ZESTRIL) 40 MG tablet Take 40 mg by mouth daily.    ? melatonin 5 MG TABS Take 10 mg by mouth at bedtime.    ? ?No current facility-administered medications for this visit.  ? ? ?Patient confirms/reports the following allergies:  ?Allergies  ?Allergen Reactions  ? Codeine Phosphate   ?  REACTION: unspecified  ? ? ?No orders of the defined types were placed in this encounter. ? ? ?AUTHORIZATION INFORMATION ?Primary Insurance: ?1D#: ?Group #: ? ?Secondary Insurance: ?1D#: ?Group #: ? ?SCHEDULE INFORMATION: ?Date: 09/18/2021 ?Time: ?Location: [facility] ?

## 2021-08-17 ENCOUNTER — Encounter: Payer: Self-pay | Admitting: Nurse Practitioner

## 2021-08-17 ENCOUNTER — Ambulatory Visit (INDEPENDENT_AMBULATORY_CARE_PROVIDER_SITE_OTHER): Payer: Medicare HMO | Admitting: Nurse Practitioner

## 2021-08-17 ENCOUNTER — Other Ambulatory Visit: Payer: Self-pay

## 2021-08-17 VITALS — BP 130/82 | HR 100 | Temp 98.0°F | Resp 18 | Ht 60.0 in | Wt 206.6 lb

## 2021-08-17 DIAGNOSIS — J069 Acute upper respiratory infection, unspecified: Secondary | ICD-10-CM

## 2021-08-17 DIAGNOSIS — J0110 Acute frontal sinusitis, unspecified: Secondary | ICD-10-CM

## 2021-08-17 DIAGNOSIS — R059 Cough, unspecified: Secondary | ICD-10-CM

## 2021-08-17 DIAGNOSIS — R0602 Shortness of breath: Secondary | ICD-10-CM

## 2021-08-17 MED ORDER — BENZONATATE 100 MG PO CAPS: 100.0000 mg | ORAL_CAPSULE | Freq: Two times a day (BID) | ORAL | 0 refills | Status: DC | PRN

## 2021-08-17 MED ORDER — ALBUTEROL SULFATE HFA 108 (90 BASE) MCG/ACT IN AERS: INHALATION_SPRAY | RESPIRATORY_TRACT | 0 refills | Status: DC

## 2021-08-17 MED ORDER — AZITHROMYCIN 250 MG PO TABS: ORAL_TABLET | ORAL | 0 refills | Status: AC

## 2021-08-17 NOTE — Progress Notes (Signed)
? ?BP 130/82   Pulse 100   Temp 98 ?F (36.7 ?C) (Oral)   Resp 18   Ht 5' (1.524 m)   Wt 206 lb 9.6 oz (93.7 kg)   SpO2 93%   BMI 40.35 kg/m?   ? ?Subjective:  ? ? Patient ID: Elizabeth Blackwell, female    DOB: July 26, 1950, 71 y.o.   MRN: [id number] ? ?HPI: ?Elizabeth Blackwell is a 71 y.o. female ? ?Chief Complaint  ?Patient presents with  ? Cough  ?  Runny nose, stuffy head, sneezing for 2 weeks  ? ?URI: Symptoms started two weeks ago with cough, nasal congestion, sneezing.  She says that she does feel short of breath.  She says she does have a productive cough. She says she also has a lot of pressure in her face. She says that the nasal congestion has gotten worse and that her mucous is very thick. She denies any fever or chest pain.  She says she has been taking coricidin and muscinex.  Discussed that she is outside the window for treatment so testing was deferred.  Will treat for sinus infection, URI and wheezing.  Discussed pushing fluids and getting rest.  Gave patient sample of Spiriva to take daily and rinse her mouth.  Refilled her albuterol, sent in prescription for azithromycin and tessalon perls. Discussed watching out for increasing shortness of breath or fever at which time we will get an xray. Patient verbalized understanding.  ? ?Relevant past medical, surgical, family and social history reviewed and updated as indicated. Interim medical history since our last visit reviewed. ?Allergies and medications reviewed and updated. ? ?Review of Systems ? ?Constitutional: Negative for fever or weight change.  ?HEENT: positive for nasal congestion and facial pressure ?Respiratory: positive for cough and shortness of breath.   ?Cardiovascular: Negative for chest pain or palpitations.  ?Gastrointestinal: Negative for abdominal pain, no bowel changes.  ?Musculoskeletal: Negative for gait problem or joint swelling.  ?Skin: Negative for rash.  ?Neurological: Negative for dizziness or headache.  ?No other  specific complaints in a complete review of systems (except as listed in HPI above).  ? ?   ?Objective:  ?  ?BP 130/82   Pulse 100   Temp 98 ?F (36.7 ?C) (Oral)   Resp 18   Ht 5' (1.524 m)   Wt 206 lb 9.6 oz (93.7 kg)   SpO2 93%   BMI 40.35 kg/m?   ?Wt Readings from Last 3 Encounters:  ?08/17/21 206 lb 9.6 oz (93.7 kg)  ?06/12/21 214 lb 3.2 oz (97.2 kg)  ?  ?Physical Exam ? ?Constitutional: Patient appears well-developed and well-nourished. Obese  No distress.  ?HEENT: head atraumatic, normocephalic, pupils equal and reactive to light, ears TMs clear, neck supple, throat within normal limits ?Cardiovascular: Normal rate, regular rhythm and normal heart sounds.  No murmur heard. No BLE edema. ?Pulmonary/Chest: Effort normal and breath sounds expiratory wheezing No respiratory distress. ?Abdominal: Soft.  There is no tenderness. ?Psychiatric: Patient has a normal mood and affect. behavior is normal. Judgment and thought content normal.  ?Results for orders placed or performed in visit on 06/13/21  ?Hemoglobin A1c  ?Result Value Ref Range  ? Hemoglobin A1C 7.5   ? ?   ?Assessment & Plan:  ? ?1. Viral upper respiratory tract infection ?-continue to take OTC treatments as discussed ?- benzonatate (TESSALON) 100 MG capsule; Take 1 capsule (100 mg total) by mouth 2 (two) times daily as needed for cough.  Dispense: 20  capsule; Refill: 0 ? ?2. Cough, unspecified type ? ?- albuterol (VENTOLIN HFA) 108 (90 Base) MCG/ACT inhaler; INHALE 2 PUFFS BY MOUTH EVERY 6 HOURS AS NEEDED FOR WHEEZING AND FOR SHORTNESS OF BREATH  Dispense: 9 g; Refill: 0 ?- benzonatate (TESSALON) 100 MG capsule; Take 1 capsule (100 mg total) by mouth 2 (two) times daily as needed for cough.  Dispense: 20 capsule; Refill: 0 ? ?3. Shortness of breath ?-if worsening shortness of breath or fever will get chest xray ?- albuterol (VENTOLIN HFA) 108 (90 Base) MCG/ACT inhaler; INHALE 2 PUFFS BY MOUTH EVERY 6 HOURS AS NEEDED FOR WHEEZING AND FOR SHORTNESS OF  BREATH  Dispense: 9 g; Refill: 0 ? ?4. Acute non-recurrent frontal sinusitis ? ?- azithromycin (ZITHROMAX) 250 MG tablet; Take 2 tablets on day 1, then 1 tablet daily on days 2 through 5  Dispense: 6 tablet; Refill: 0  ? ?Follow up plan: ?Return if symptoms worsen or fail to improve. ? ? ? ? ? ?

## 2021-08-18 ENCOUNTER — Ambulatory Visit: Payer: Self-pay

## 2021-08-18 NOTE — Telephone Encounter (Signed)
?  Chief Complaint: vaginal itching ?Symptoms: vaginal itching ?Frequency: today ?Pertinent Negatives: Patient denies burning, discharge or odor ?Disposition: [] ED /[] Urgent Care (no appt availability in office) / [] Appointment(In office/virtual)/ []  [facility]/ [x] Home Care/ [] Refused Recommended Disposition /[] [place] [facility]/ []  Follow-up with PCP ?Additional Notes: Pt was started on abx and was told by provider to f/up if any symptoms developed. Pt was calling to notify Almyra Free, NP of the itching. Advised pt that office closed at 1 and would be Monday before f/up and to try OTC monistat and eat yogurt as well. Pt verbalized understanding.  ? ?Summary: personal discomfort / rx request  ? The patient has previously been prescribed antibiotics and is now experiencing personal discomfort  ? ?The patient first noticed the discomfort this morning  ? ?The patient would like to be prescribed something for their symptoms (itching)  ? ?Please contact further   ?  ? ?Reason for Disposition ? Mild vaginal itching ? ?Answer Assessment - Initial Assessment Questions ?1. SYMPTOM: "What's the main symptom you're concerned about?" (e.g., pain, itching, dryness) ?    Itching  ?2. LOCATION: "Where is the  sx located?" (e.g., inside/outside, left/right) ?    outside ?3. ONSET: "When did the  sx  start?" ?    today ?4. PAIN: "Is there any pain?" If Yes, ask: "How bad is it?" (Scale: 1-10; mild, moderate, severe) ?    0 ?5. ITCHING: "Is there any itching?" If Yes, ask: "How bad is it?" (Scale: 1-10; mild, moderate, severe) ?    Mild to moderate ?6. CAUSE: "What do you think is causing the discharge?" "Have you had the same problem before? What happened then?" ?    abx ?7. OTHER SYMPTOMS: "Do you have any other symptoms?" (e.g., fever, itching, vaginal bleeding, pain with urination, injury to genital area, vaginal foreign body) ?    NA ? ?Protocols used: Vaginal Symptoms-A-AH ? ?

## 2021-08-21 ENCOUNTER — Other Ambulatory Visit: Payer: Self-pay | Admitting: Nurse Practitioner

## 2021-08-21 DIAGNOSIS — B379 Candidiasis, unspecified: Secondary | ICD-10-CM

## 2021-08-21 MED ORDER — FLUCONAZOLE 150 MG PO TABS: 150.0000 mg | ORAL_TABLET | ORAL | 0 refills | Status: DC | PRN

## 2021-08-21 NOTE — Telephone Encounter (Signed)
Patient is till having vaginal itching. Please send to [address] ?

## 2021-08-25 ENCOUNTER — Telehealth: Payer: Self-pay

## 2021-08-25 NOTE — Telephone Encounter (Signed)
Called patient to reschedule colonoscopy because Dr. Allegra Lai is not in the office on 09/18/21. Patient sates she had already called to asked to move her procedure and left a message. She states she wants to do it on 12/11/21. Called trish and got her moved  ?

## 2021-08-28 ENCOUNTER — Other Ambulatory Visit: Payer: Self-pay | Admitting: Nurse Practitioner

## 2021-08-28 DIAGNOSIS — T3695XA Adverse effect of unspecified systemic antibiotic, initial encounter: Secondary | ICD-10-CM

## 2021-08-28 NOTE — Telephone Encounter (Signed)
Requested medication (s) are due for refill today: Unsure if was to be renewed ? ?Requested medication (s) are on the active medication list: yes ? ?Last refill:  08/21/21 #2 with 0 RF ? ?Future visit scheduled: 12/08/21 ? ?Notes to clinic:  Not sure this is to be renewed, seems like a one time order, please assess. ? ? ?  ? ?Requested Prescriptions  ?Pending Prescriptions Disp Refills  ? fluconazole (DIFLUCAN) 150 MG tablet [Pharmacy Med Name: Fluconazole 150 MG Oral Tablet] 2 tablet 0  ?  Sig: TAKE 1 TABLET BY MOUTH EVERY 3 DAYS AS NEEDED FOR VAGINAL ITCHING/YEAST INFECTION SYMPTOMS  ?  ? Off-Protocol Failed - 08/28/2021  9:57 AM  ?  ?  Failed - Medication not assigned to a protocol, review manually.  ?  ?  Passed - Valid encounter within last 12 months  ?  Recent Outpatient Visits   ? ?      ? 1 week ago Viral upper respiratory tract infection  ? [facility] Della Goo F, FNP  ? 2 months ago Essential hypertension  ? [facility] Berniece Salines, FNP  ? ?  ?  ?Future Appointments   ? ?        ? In 3 months Zane Herald, Rudolpho Sevin, FNP [facility]  ? ?  ? ? ?  ?  ?  ? ? ?

## 2021-10-16 ENCOUNTER — Other Ambulatory Visit: Payer: Self-pay

## 2021-10-16 ENCOUNTER — Ambulatory Visit (INDEPENDENT_AMBULATORY_CARE_PROVIDER_SITE_OTHER): Payer: Medicare HMO | Admitting: Nurse Practitioner

## 2021-10-16 ENCOUNTER — Encounter: Payer: Self-pay | Admitting: Nurse Practitioner

## 2021-10-16 VITALS — BP 126/82 | HR 94 | Temp 97.9°F | Resp 16 | Ht 60.0 in | Wt 206.6 lb

## 2021-10-16 DIAGNOSIS — E119 Type 2 diabetes mellitus without complications: Secondary | ICD-10-CM

## 2021-10-16 DIAGNOSIS — M5441 Lumbago with sciatica, right side: Secondary | ICD-10-CM

## 2021-10-16 DIAGNOSIS — Z6841 Body Mass Index (BMI) 40.0 and over, adult: Secondary | ICD-10-CM

## 2021-10-16 DIAGNOSIS — Z13220 Encounter for screening for lipoid disorders: Secondary | ICD-10-CM

## 2021-10-16 DIAGNOSIS — Z114 Encounter for screening for human immunodeficiency virus [HIV]: Secondary | ICD-10-CM | POA: Diagnosis not present

## 2021-10-16 DIAGNOSIS — I10 Essential (primary) hypertension: Secondary | ICD-10-CM | POA: Diagnosis not present

## 2021-10-16 DIAGNOSIS — E7849 Other hyperlipidemia: Secondary | ICD-10-CM | POA: Insufficient documentation

## 2021-10-16 DIAGNOSIS — G8929 Other chronic pain: Secondary | ICD-10-CM | POA: Insufficient documentation

## 2021-10-16 DIAGNOSIS — Z1159 Encounter for screening for other viral diseases: Secondary | ICD-10-CM

## 2021-10-16 DIAGNOSIS — B3731 Acute candidiasis of vulva and vagina: Secondary | ICD-10-CM

## 2021-10-16 MED ORDER — LISINOPRIL 40 MG PO TABS: 40.0000 mg | ORAL_TABLET | Freq: Every day | ORAL | 1 refills | Status: DC

## 2021-10-16 MED ORDER — LINAGLIPTIN 5 MG PO TABS: 5.0000 mg | ORAL_TABLET | Freq: Every day | ORAL | 1 refills | Status: DC

## 2021-10-16 MED ORDER — FLUCONAZOLE 150 MG PO TABS: 150.0000 mg | ORAL_TABLET | ORAL | 0 refills | Status: DC | PRN

## 2021-10-16 MED ORDER — IBUPROFEN 800 MG PO TABS: 800.0000 mg | ORAL_TABLET | Freq: Two times a day (BID) | ORAL | 0 refills | Status: DC | PRN

## 2021-10-16 MED ORDER — TERCONAZOLE 0.4 % VA CREA: 1.0000 | TOPICAL_CREAM | Freq: Every day | VAGINAL | 0 refills | Status: DC

## 2021-10-16 NOTE — Assessment & Plan Note (Signed)
Current weight is 206 pounds with a BMI of 40.35 patient is working on eating healthier and getting physically active.

## 2021-10-16 NOTE — Assessment & Plan Note (Signed)
Patient had an elevated LDL in 2017.  She is not currently on medication we will get lab work today.

## 2021-10-16 NOTE — Assessment & Plan Note (Signed)
Patient reports that she has chronic low back pain.  She says sometimes it goes down her right leg.  She is requesting ibuprofen 800 mg.  Prescription sent.

## 2021-10-16 NOTE — Assessment & Plan Note (Signed)
Is at goal at 126/82.  Continue taking lisinopril 40 mg daily.

## 2021-10-16 NOTE — Progress Notes (Signed)
BP 126/82   Pulse 94   Temp 97.9 F (36.6 C) (Oral)   Resp 16   Ht 5' (1.524 m)   Wt 206 lb 9.6 oz (93.7 kg)   SpO2 98%   BMI 40.35 kg/m    Subjective:    Patient ID: Elizabeth Blackwell, female    DOB: 1950-12-18, 71 y.o.   MRN: [id number]  HPI: Elizabeth Blackwell is a 71 y.o. female  Chief Complaint  Patient presents with   Diabetes   Hypertension   Vaginal Itching   HTN: She does not check her blood pressure at home.  She does deny any chest pain, headaches, shortness of breath or blurred vision.  She is currently taking lisinopril 40 mg daily.  Her blood pressure today is  126/82.   DM2: She does not routinely check her blood sugar.  She says that her blood sugar was 135 this morning. She is currently taking linagliptin 5 mg daily.  She does not tolerate metformin.  She had a diabetic eye exam at [facility] we will request records.  She denies any polyuria, polydipsia or polyphasia.  We will get blood work today.     Hyperlipidemia: LDL was 125 in 2017.  She is not currently on any medication for her cholesterol.  We will get lab work today.  Obesity: Her current weight is 206 lbs with a BMI of 40.35.  She is working on eating healthier and get staying physically active.  Vaginal itching: She says that she has had vaginal itching for several weeks.  Patient is not sexually active.  Patient states she has tried over-the-counter treatments with no success.  We will send in terconazole vaginal cream and Diflucan.  Low back pain: She says she has had back surgery and has chronic back pain.  She says the pain sometimes radiates down her right leg.  She says she is using warm compresses at home and taking Tylenol and ibuprofen for pain. she says she has been taking ibuprofen but would like to get a prescription.   Relevant past medical, surgical, family and social history reviewed and updated as indicated. Interim medical history since our last visit reviewed. Allergies and  medications reviewed and updated.  Review of Systems  Constitutional: Negative for fever or weight change.  Respiratory: Negative for cough and shortness of breath.   Cardiovascular: Negative for chest pain or palpitations.  Gastrointestinal: Negative for abdominal pain, no bowel changes.  Musculoskeletal: Negative for gait problem or joint swelling.  Positive for low back pain Skin: Negative for rash.  Neurological: Negative for dizziness or headache.  No other specific complaints in a complete review of systems (except as listed in HPI above).      Objective:    BP 126/82   Pulse 94   Temp 97.9 F (36.6 C) (Oral)   Resp 16   Ht 5' (1.524 m)   Wt 206 lb 9.6 oz (93.7 kg)   SpO2 98%   BMI 40.35 kg/m   Wt Readings from Last 3 Encounters:  10/16/21 206 lb 9.6 oz (93.7 kg)  08/17/21 206 lb 9.6 oz (93.7 kg)  06/12/21 214 lb 3.2 oz (97.2 kg)    Physical Exam  Constitutional: Patient appears well-developed and well-nourished. Obese  No distress.  HEENT: head atraumatic, normocephalic, pupils equal and reactive to light,  neck supple Cardiovascular: Normal rate, regular rhythm and normal heart sounds.  No murmur heard. No BLE edema. Pulmonary/Chest: Effort normal and breath  sounds normal. No respiratory distress. Abdominal: Soft.  There is no tenderness. Psychiatric: Patient has a normal mood and affect. behavior is normal. Judgment and thought content normal. \ Diabetic Foot Exam - Simple   Simple Foot Form Diabetic Foot exam was performed with the following findings: Yes 10/16/2021  8:44 AM  Visual Inspection No deformities, no ulcerations, no other skin breakdown bilaterally: Yes Sensation Testing Intact to touch and monofilament testing bilaterally: Yes Pulse Check Posterior Tibialis and Dorsalis pulse intact bilaterally: Yes Comments     Results for orders placed or performed in visit on 06/13/21  Hemoglobin A1c  Result Value Ref Range   Hemoglobin A1C 7.5        Assessment & Plan:   Problem List Items Addressed This Visit       Cardiovascular and Mediastinum   Essential hypertension - Primary    Is at goal at 126/82.  Continue taking lisinopril 40 mg daily.       Relevant Medications   lisinopril (ZESTRIL) 40 MG tablet   Other Relevant Orders   CBC with Differential/Platelet   COMPLETE METABOLIC PANEL WITH GFR     Endocrine   Diabetes mellitus type II, controlled, with no complications (HCC)    Patient states her blood sugar this morning was 135.  She is taking linagliptin 5 mg daily.  We will get lab work today.  She had an eye exam done at [facility].  Doing foot exam today.       Relevant Medications   lisinopril (ZESTRIL) 40 MG tablet   linagliptin (TRADJENTA) 5 MG TABS tablet   Other Relevant Orders   COMPLETE METABOLIC PANEL WITH GFR   Hemoglobin A1c   Microalbumin / creatinine urine ratio   HM Diabetes Foot Exam (Completed)     Nervous and Auditory   Chronic right-sided low back pain with right-sided sciatica   Relevant Medications   ibuprofen (ADVIL) 800 MG tablet     Other   Morbid obesity with BMI of 40.0-44.9, adult (HCC)    Current weight is 206 pounds with a BMI of 40.35 patient is working on eating healthier and getting physically active.       Relevant Medications   linagliptin (TRADJENTA) 5 MG TABS tablet   Other Visit Diagnoses     Screening for HIV without presence of risk factors       Relevant Medications   fluconazole (DIFLUCAN) 150 MG tablet   Other Relevant Orders   HIV Antibody (routine testing w rflx)   Encounter for hepatitis C screening test for low risk patient       Relevant Orders   Hepatitis C antibody   Other hyperlipidemia       Relevant Medications   lisinopril (ZESTRIL) 40 MG tablet   Other Relevant Orders   Lipid panel   Screening for cholesterol level       Relevant Orders   Lipid panel   Vaginal yeast infection       Relevant Medications   fluconazole (DIFLUCAN) 150  MG tablet   terconazole (TERAZOL 7) 0.4 % vaginal cream        Follow up plan: Return in about 6 months (around 04/17/2022) for follow up.

## 2021-10-16 NOTE — Assessment & Plan Note (Signed)
Patient states her blood sugar this morning was 135.  She is taking linagliptin 5 mg daily.  We will get lab work today.  She had an eye exam done at [facility].  Doing foot exam today.

## 2021-10-17 LAB — COMPLETE METABOLIC PANEL WITH GFR
AG Ratio: 1.5 (calc) (ref 1.0–2.5)
ALT: 14 U/L (ref 6–29)
AST: 13 U/L (ref 10–35)
Albumin: 4.1 g/dL (ref 3.6–5.1)
Alkaline phosphatase (APISO): 125 U/L (ref 37–153)
BUN: 11 mg/dL (ref 7–25)
CO2: 26 mmol/L (ref 20–32)
Calcium: 9.4 mg/dL (ref 8.6–10.4)
Chloride: 103 mmol/L (ref 98–110)
Creat: 0.67 mg/dL (ref 0.50–1.05)
Globulin: 2.7 g/dL (calc) (ref 1.9–3.7)
Glucose, Bld: 323 mg/dL — ABNORMAL HIGH (ref 65–99)
Potassium: 4.5 mmol/L (ref 3.5–5.3)
Sodium: 137 mmol/L (ref 135–146)
Total Bilirubin: 0.3 mg/dL (ref 0.2–1.2)
Total Protein: 6.8 g/dL (ref 6.1–8.1)
eGFR: 95 mL/min/{1.73_m2} (ref >=60)

## 2021-10-17 LAB — CBC WITH DIFFERENTIAL/PLATELET
Absolute Monocytes: 547 cells/uL (ref 200–950)
Basophils Absolute: 43 cells/uL (ref 0–200)
Basophils Relative: 0.6 %
Eosinophils Absolute: 156 cells/uL (ref 15–500)
Eosinophils Relative: 2.2 %
HCT: 45.7 % — ABNORMAL HIGH (ref 35.0–45.0)
Hemoglobin: 14.6 g/dL (ref 11.7–15.5)
Lymphs Abs: 2819 cells/uL (ref 850–3900)
MCH: 29 pg (ref 27.0–33.0)
MCHC: 31.9 g/dL — ABNORMAL LOW (ref 32.0–36.0)
MCV: 90.7 fL (ref 80.0–100.0)
MPV: 12.7 fL — ABNORMAL HIGH (ref 7.5–12.5)
Monocytes Relative: 7.7 %
Neutro Abs: 3536 cells/uL (ref 1500–7800)
Neutrophils Relative %: 49.8 %
Platelets: 197 10*3/uL (ref 140–400)
RBC: 5.04 10*6/uL (ref 3.80–5.10)
RDW: 12.1 % (ref 11.0–15.0)
Total Lymphocyte: 39.7 %
WBC: 7.1 10*3/uL (ref 3.8–10.8)

## 2021-10-17 LAB — LIPID PANEL
Cholesterol: 179 mg/dL (ref <200)
HDL: 41 mg/dL — ABNORMAL LOW (ref >=50)
LDL Cholesterol (Calc): 120 mg/dL (calc) — ABNORMAL HIGH
Non-HDL Cholesterol (Calc): 138 mg/dL (calc) — ABNORMAL HIGH (ref <130)
Total CHOL/HDL Ratio: 4.4 (calc) (ref <5.0)
Triglycerides: 84 mg/dL (ref <150)

## 2021-10-17 LAB — HEPATITIS C ANTIBODY
Hepatitis C Ab: NONREACTIVE
SIGNAL TO CUT-OFF: 0.12 (ref <1.00)

## 2021-10-17 LAB — HEMOGLOBIN A1C
Hgb A1c MFr Bld: 13.6 % of total Hgb — ABNORMAL HIGH (ref <5.7)
Mean Plasma Glucose: 344 mg/dL
eAG (mmol/L): 19 mmol/L

## 2021-10-17 LAB — MICROALBUMIN / CREATININE URINE RATIO
Creatinine, Urine: 113 mg/dL (ref 20–275)
Microalb Creat Ratio: 45 mcg/mg creat — ABNORMAL HIGH (ref <30)
Microalb, Ur: 5.1 mg/dL

## 2021-10-17 LAB — HIV ANTIBODY (ROUTINE TESTING W REFLEX): HIV 1&2 Ab, 4th Generation: NONREACTIVE

## 2021-10-18 ENCOUNTER — Other Ambulatory Visit: Payer: Self-pay | Admitting: Nurse Practitioner

## 2021-10-18 DIAGNOSIS — E1165 Type 2 diabetes mellitus with hyperglycemia: Secondary | ICD-10-CM

## 2021-10-18 MED ORDER — RYBELSUS 3 MG PO TABS: 3.0000 mg | ORAL_TABLET | Freq: Every day | ORAL | 0 refills | Status: DC

## 2021-10-18 MED ORDER — RYBELSUS 7 MG PO TABS: 7.0000 mg | ORAL_TABLET | Freq: Every day | ORAL | 2 refills | Status: DC

## 2021-11-13 IMAGING — MG MM DIGITAL SCREENING BILAT W/ TOMO AND CAD
8 series · 8 of 24 positions shown · non-contrast
Comparison: Previous exam(s).

CLINICAL DATA: Screening.

EXAM:
DIGITAL SCREENING BILATERAL MAMMOGRAM WITH TOMOSYNTHESIS AND CAD
TECHNIQUE: Bilateral screening digital craniocaudal and mediolateral oblique
mammograms were obtained. Bilateral screening digital breast
tomosynthesis was performed. The images were evaluated with
computer-aided detection.

[R CC synth-2D]
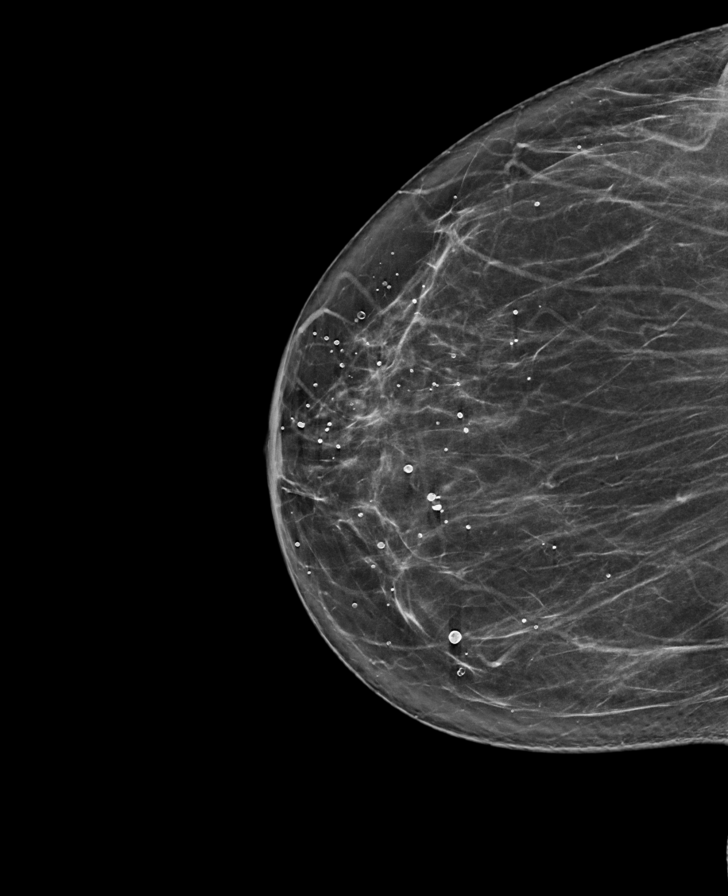

[L CC synth-2D]
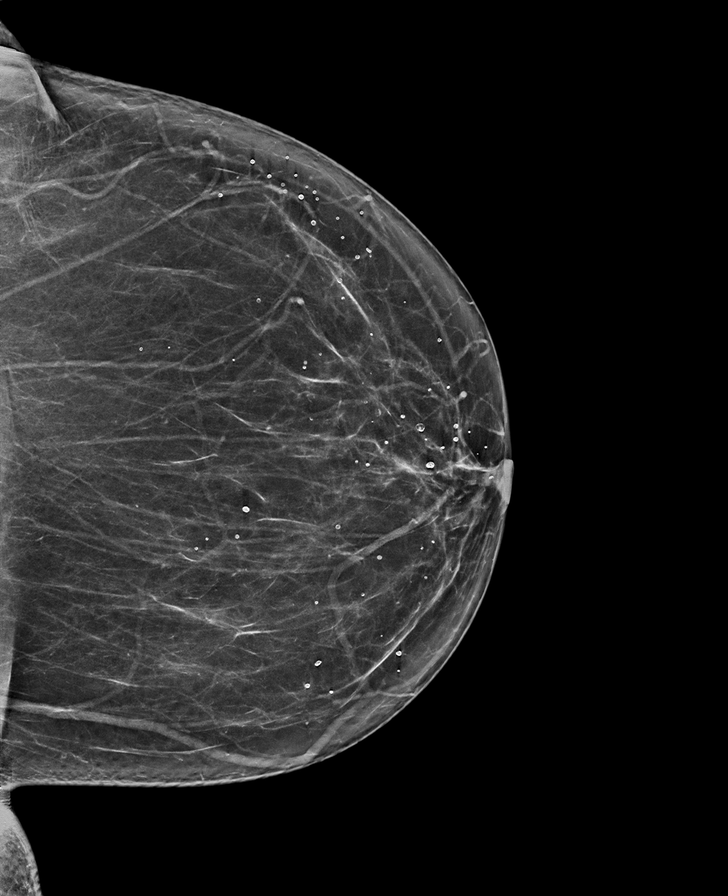

[L MLO synth-2D]
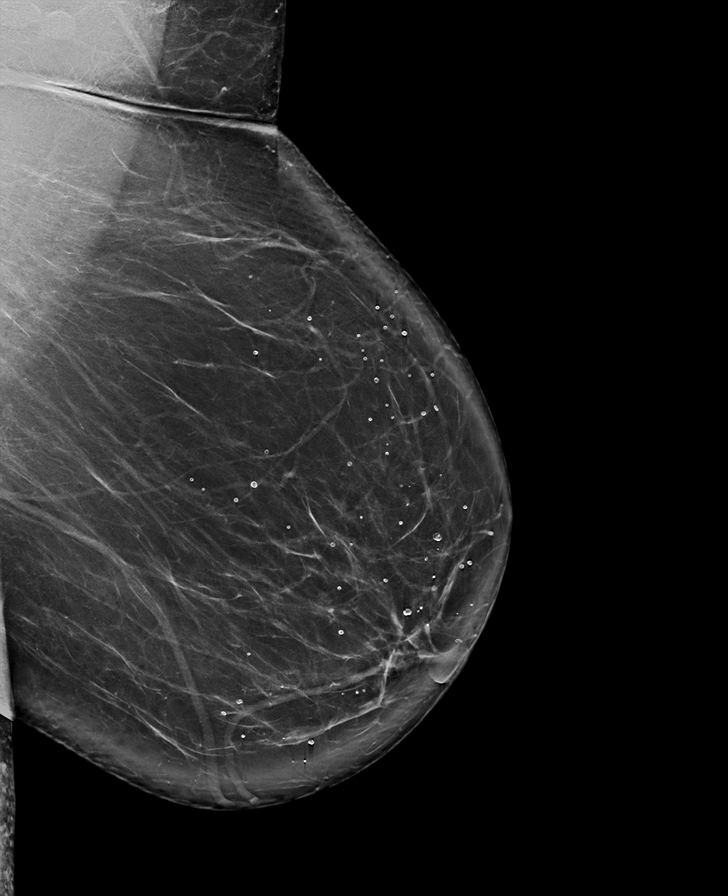

[R MLO synth-2D]
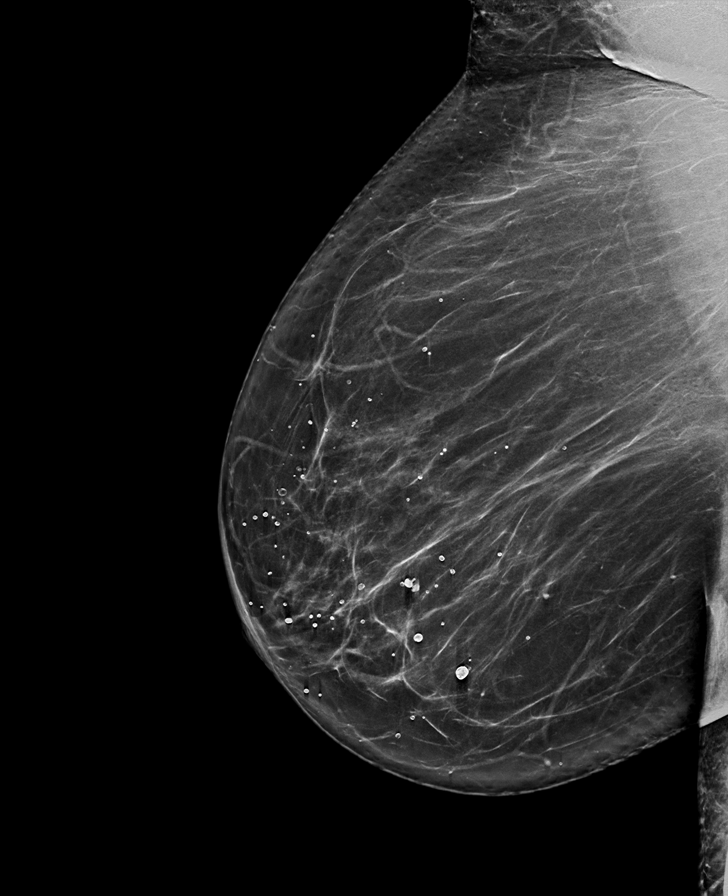

[R CC tomo · tomo slice 39/77.0]
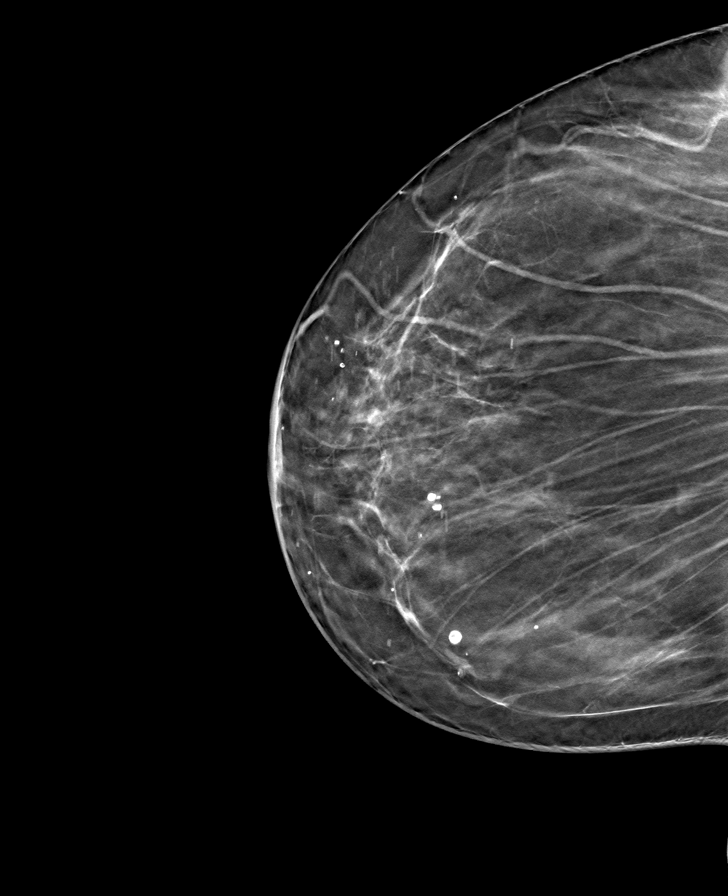

[L MLO tomo · tomo slice 52/103.0]
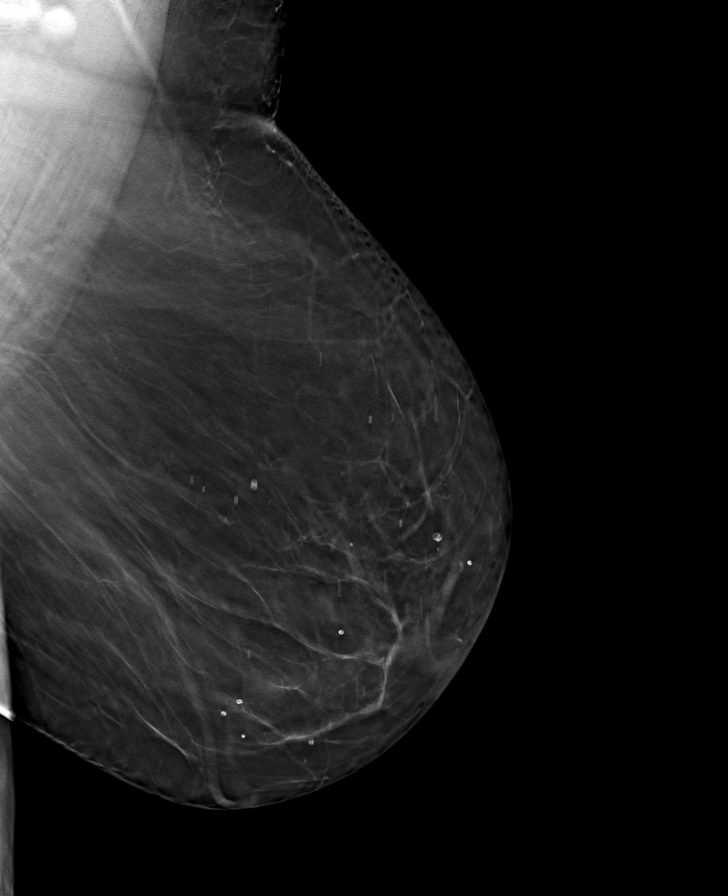

[R MLO tomo · tomo slice 42/83.0]
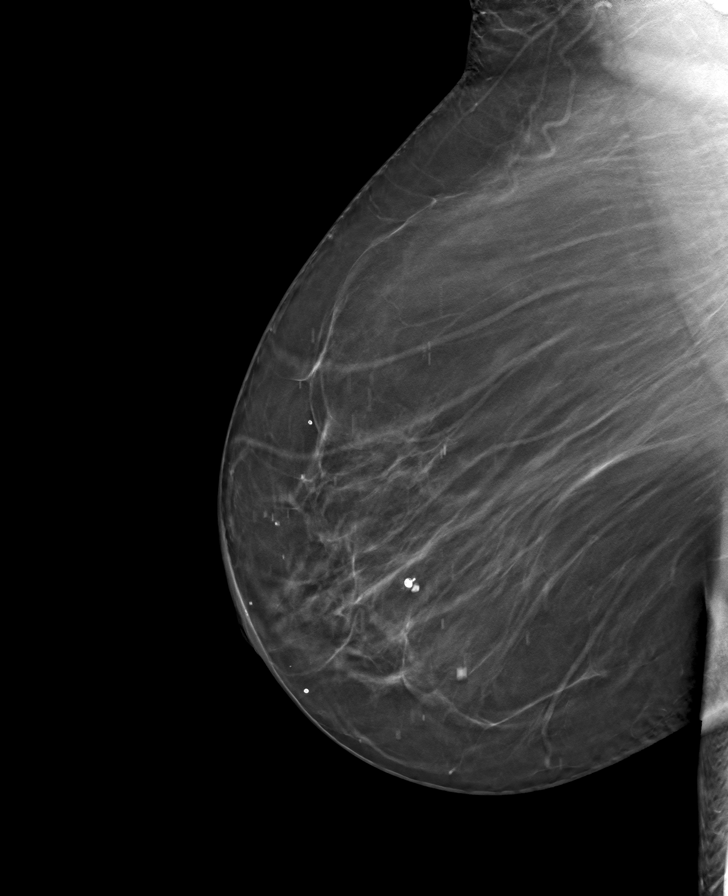

[L CC tomo · tomo slice 37/73.0]
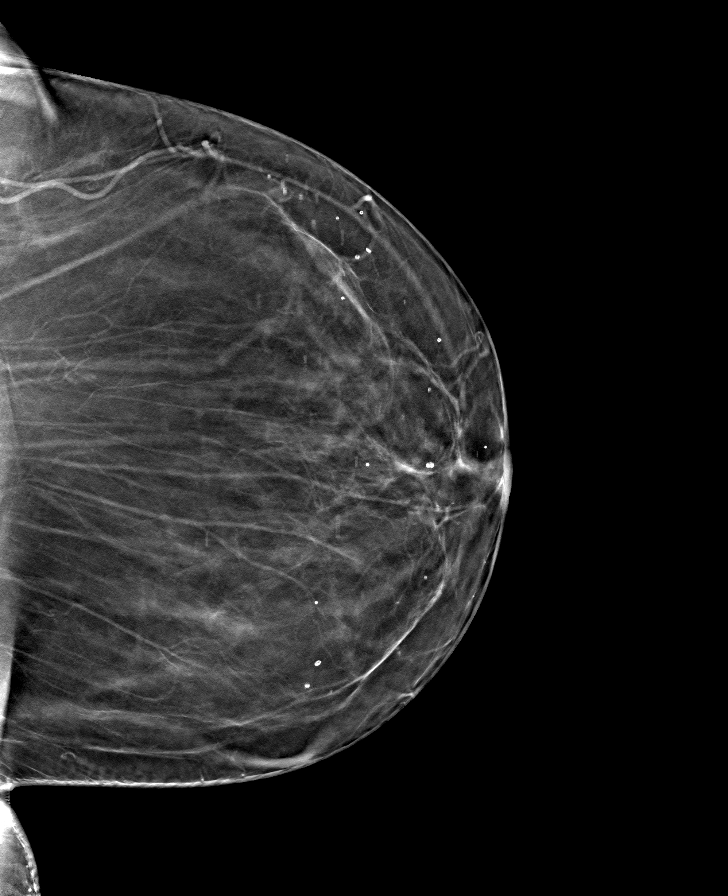

[8 of 24 positions shown; findings below may reference images not displayed]

ACR Breast Density Category b: There are scattered areas of
fibroglandular density.
FINDINGS: There are no findings suspicious for malignancy.
IMPRESSION: No mammographic evidence of malignancy. A result letter of this
screening mammogram will be mailed directly to the patient.

RECOMMENDATION:
Screening mammogram in one year. (Code:51-O-LD2)

BI-RADS CATEGORY  1: Negative.

## 2021-11-30 LAB — HM DIABETES FOOT EXAM: HM Diabetic Foot Exam: NORMAL

## 2021-12-08 ENCOUNTER — Ambulatory Visit: Payer: Medicare HMO | Admitting: Nurse Practitioner

## 2021-12-08 ENCOUNTER — Encounter: Payer: Self-pay | Admitting: Gastroenterology

## 2021-12-11 ENCOUNTER — Encounter
Admission: RE | Disposition: A | Discharge status: Home / Self Care | Payer: Self-pay | Source: Home / Self Care | Attending: Gastroenterology

## 2021-12-11 ENCOUNTER — Ambulatory Visit: Payer: Medicare HMO | Admitting: Anesthesiology

## 2021-12-11 ENCOUNTER — Ambulatory Visit
Admission: RE | Admit: 2021-12-11 | Discharge: 2021-12-11 | Disposition: A | Discharge status: Home / Self Care | Payer: Medicare HMO | Attending: Gastroenterology | Admitting: Gastroenterology

## 2021-12-11 ENCOUNTER — Encounter: Payer: Self-pay | Admitting: Gastroenterology

## 2021-12-11 DIAGNOSIS — Z1211 Encounter for screening for malignant neoplasm of colon: Secondary | ICD-10-CM | POA: Insufficient documentation

## 2021-12-11 DIAGNOSIS — I10 Essential (primary) hypertension: Secondary | ICD-10-CM | POA: Diagnosis not present

## 2021-12-11 DIAGNOSIS — E119 Type 2 diabetes mellitus without complications: Secondary | ICD-10-CM | POA: Insufficient documentation

## 2021-12-11 DIAGNOSIS — Z800 Family history of malignant neoplasm of digestive organs: Secondary | ICD-10-CM | POA: Diagnosis not present

## 2021-12-11 DIAGNOSIS — Z86010 Personal history of colonic polyps: Secondary | ICD-10-CM

## 2021-12-11 HISTORY — PX: COLONOSCOPY WITH PROPOFOL: SHX5780

## 2021-12-11 LAB — GLUCOSE, CAPILLARY: Glucose-Capillary: 189 mg/dL — ABNORMAL HIGH (ref 70–99)

## 2021-12-11 SURGERY — COLONOSCOPY WITH PROPOFOL
Anesthesia: General

## 2021-12-11 MED ORDER — PROPOFOL 10 MG/ML IV BOLUS
INTRAVENOUS | Status: DC | PRN
  Administered 2021-12-11: 50 mg via INTRAVENOUS
  Administered 2021-12-11: 20 mg via INTRAVENOUS

## 2021-12-11 MED ORDER — PROPOFOL 500 MG/50ML IV EMUL
INTRAVENOUS | Status: DC | PRN
  Administered 2021-12-11: 150 ug/kg/min via INTRAVENOUS

## 2021-12-11 MED ORDER — LIDOCAINE HCL (CARDIAC) PF 100 MG/5ML IV SOSY
PREFILLED_SYRINGE | INTRAVENOUS | Status: DC | PRN
  Administered 2021-12-11: 50 mg via INTRAVENOUS

## 2021-12-11 MED ORDER — SODIUM CHLORIDE 0.9 % IV SOLN: INTRAVENOUS | Status: DC

## 2021-12-11 NOTE — Anesthesia Preprocedure Evaluation (Signed)
Anesthesia Evaluation  Patient identified by MRN, date of birth, ID band Patient awake    Reviewed: Allergy & Precautions, NPO status , Patient's Chart, lab work & pertinent test results  History of Anesthesia Complications Negative for: history of anesthetic complications  Airway Mallampati: III  TM Distance: >3 FB Neck ROM: full    Dental  (+) Chipped   Pulmonary neg pulmonary ROS, neg shortness of breath,    Pulmonary exam normal        Cardiovascular Exercise Tolerance: Good hypertension, (-) angina(-) Past MI Normal cardiovascular exam     Neuro/Psych  Neuromuscular disease negative psych ROS   GI/Hepatic negative GI ROS, Neg liver ROS, neg GERD  ,  Endo/Other  diabetes, Type 2  Renal/GU negative Renal ROS  negative genitourinary   Musculoskeletal   Abdominal   Peds  Hematology negative hematology ROS (+)   Anesthesia Other Findings Past Medical History: No date: Diabetes mellitus without complication (HCC) No date: Hypertension  Past Surgical History: No date: BACK SURGERY No date: COLONOSCOPY WITH PROPOFOL     Reproductive/Obstetrics negative OB ROS                             Anesthesia Physical Anesthesia Plan  ASA: 3  Anesthesia Plan: General   Post-op Pain Management:    Induction: Intravenous  PONV Risk Score and Plan: Propofol infusion and TIVA  Airway Management Planned: Natural Airway and Nasal Cannula  Additional Equipment:   Intra-op Plan:   Post-operative Plan:   Informed Consent: I have reviewed the patients History and Physical, chart, labs and discussed the procedure including the risks, benefits and alternatives for the proposed anesthesia with the patient or authorized representative who has indicated his/her understanding and acceptance.     Dental Advisory Given  Plan Discussed with: Anesthesiologist, CRNA and Surgeon  Anesthesia Plan  Comments: (Patient consented for risks of anesthesia including but not limited to:  - adverse reactions to medications - risk of airway placement if required - damage to eyes, teeth, lips or other oral mucosa - nerve damage due to positioning  - sore throat or hoarseness - Damage to heart, brain, nerves, lungs, other parts of body or loss of life  Patient voiced understanding.)        Anesthesia Quick Evaluation

## 2021-12-11 NOTE — Anesthesia Postprocedure Evaluation (Signed)
Anesthesia Post Note  Patient: Elizabeth Blackwell  Procedure(s) Performed: COLONOSCOPY WITH PROPOFOL  Patient location during evaluation: Endoscopy Anesthesia Type: General Level of consciousness: awake and alert Pain management: pain level controlled Vital Signs Assessment: post-procedure vital signs reviewed and stable Respiratory status: spontaneous breathing, nonlabored ventilation, respiratory function stable and patient connected to nasal cannula oxygen Cardiovascular status: blood pressure returned to baseline and stable Postop Assessment: no apparent nausea or vomiting Anesthetic complications: no   No notable events documented.   Last Vitals:  Vitals:   12/11/21 1024 12/11/21 1033  BP: 128/82 127/77  Pulse: 87 79  Resp: 18 17  Temp:    SpO2: 100% 99%    Last Pain:  Vitals:   12/11/21 1033  TempSrc:   PainSc: 0-No pain                 Cleda Mccreedy Deral Schellenberg

## 2021-12-11 NOTE — Transfer of Care (Signed)
Immediate Anesthesia Transfer of Care Note  Patient: SONNET RIZOR  Procedure(s) Performed: COLONOSCOPY WITH PROPOFOL  Patient Location: PACU  Anesthesia Type:General  Level of Consciousness: awake and alert   Airway & Oxygen Therapy: Patient Spontanous Breathing  Post-op Assessment: Report given to RN and Post -op Vital signs reviewed and stable  Post vital signs: Reviewed and stable  Last Vitals:  Vitals Value Taken Time  BP 114/76 12/11/21 1014  Temp 36.4 C 12/11/21 1014  Pulse 96 12/11/21 1015  Resp 21 12/11/21 1015  SpO2 100 % 12/11/21 1015    Last Pain:  Vitals:   12/11/21 1014  TempSrc: Temporal  PainSc: 0-No pain         Complications: No notable events documented.

## 2021-12-11 NOTE — Anesthesia Procedure Notes (Signed)
Procedure Name: MAC Date/Time: 12/11/2021 9:51 AM  Performed by: Johnna Acosta, CRNAPre-anesthesia Checklist: Patient identified, Emergency Drugs available, Suction available and Patient being monitored Patient Re-evaluated:Patient Re-evaluated prior to induction Oxygen Delivery Method: Nasal cannula

## 2021-12-11 NOTE — H&P (Signed)
Arlyss Repress, MD [address]  [address]  [place], [place] [address]  Main: [phone]  Fax: [phone] Pager: [phone]  Primary Care Physician:  Berniece Salines, FNP Primary Gastroenterologist:  Dr. Arlyss Repress  Pre-Procedure History & Physical: HPI:  Elizabeth Blackwell is a 71 y.o. female is here for an colonoscopy.   Past Medical History:  Diagnosis Date   Diabetes mellitus without complication (HCC)    Hypertension     Past Surgical History:  Procedure Laterality Date   BACK SURGERY     COLONOSCOPY WITH PROPOFOL      Prior to Admission medications   Medication Sig Start Date End Date Taking? Authorizing Provider  linagliptin (TRADJENTA) 5 MG TABS tablet Take 5 mg by mouth daily.   Yes [provider]  lisinopril (ZESTRIL) 40 MG tablet Take 1 tablet (40 mg total) by mouth daily. 10/16/21  Yes Berniece Salines, FNP  fluconazole (DIFLUCAN) 150 MG tablet Take 1 tablet (150 mg total) by mouth every 3 (three) days as needed (for vaginal itching/yeast infection sx). 08/21/21   Berniece Salines, FNP  fluconazole (DIFLUCAN) 150 MG tablet Take 1 tablet (150 mg total) by mouth every 3 (three) days as needed (for vaginal itching/yeast infection sx). 10/16/21   Berniece Salines, FNP  ibuprofen (ADVIL) 800 MG tablet Take 1 tablet (800 mg total) by mouth 2 (two) times daily as needed for moderate pain. 10/16/21   Berniece Salines, FNP  melatonin 5 MG TABS Take 10 mg by mouth at bedtime.    [provider]  Semaglutide (RYBELSUS) 3 MG TABS Take 3 mg by mouth daily. 10/18/21   Berniece Salines, FNP  Semaglutide (RYBELSUS) 7 MG TABS Take 7 mg by mouth daily. 11/11/21   Berniece Salines, FNP  terconazole (TERAZOL 7) 0.4 % vaginal cream Place 1 applicator vaginally at bedtime. 10/16/21   Berniece Salines, FNP    Allergies as of 08/03/2021 - Review Complete 08/03/2021  Allergen Reaction Noted   Codeine phosphate  02/26/2005    History reviewed. No pertinent family  history.  Social History   Socioeconomic History   Marital status: Divorced    Spouse name: Not on file   Number of children: 2   Years of education: Not on file   Highest education level: Not on file  Occupational History   Not on file  Tobacco Use   Smoking status: Never   Smokeless tobacco: Never  Vaping Use   Vaping Use: Never used  Substance and Sexual Activity   Alcohol use: Yes    Alcohol/week: 1.0 standard drink of alcohol    Types: 1 Cans of beer per week   Drug use: Never   Sexual activity: Not Currently  Other Topics Concern   Not on file  Social History Narrative   Pt lives alone   Social Determinants of Health   Financial Resource Strain: Low Risk  (08/01/2021)   Overall Financial Resource Strain (CARDIA)    Difficulty of Paying Living Expenses: Not hard at all  Food Insecurity: No Food Insecurity (08/01/2021)   Hunger Vital Sign    Worried About Running Out of Food in the Last Year: Never true    Ran Out of Food in the Last Year: Never true  Transportation Needs: No Transportation Needs (08/01/2021)   PRAPARE - Administrator, Civil Service (Medical): No    Lack of Transportation (Non-Medical): No  Physical Activity: Inactive (08/01/2021)  Exercise Vital Sign    Days of Exercise per Week: 0 days    Minutes of Exercise per Session: 0 min  Stress: No Stress Concern Present (08/01/2021)   [employer] of Occupational Health - Occupational Stress Questionnaire    Feeling of Stress : Not at all  Social Connections: Moderately Isolated (08/01/2021)   Social Connection and Isolation Panel [NHANES]    Frequency of Communication with Friends and Family: More than three times a week    Frequency of Social Gatherings with Friends and Family: More than three times a week    Attends Religious Services: More than 4 times per year    Active Member of [employer] or Organizations: No    Attends Banker Meetings: Never    Marital Status: Divorced   Catering manager Violence: Not At Risk (08/01/2021)   Humiliation, Afraid, Rape, and Kick questionnaire    Fear of Current or Ex-Partner: No    Emotionally Abused: No    Physically Abused: No    Sexually Abused: No    Review of Systems: See HPI, otherwise negative ROS  Physical Exam: BP (!) 141/80   Pulse 98   Temp (!) 96.3 F (35.7 C) (Temporal)   Resp 18   Ht 5' (1.524 m)   Wt 90.3 kg   SpO2 100%   BMI 38.86 kg/m  General:   Alert,  pleasant and cooperative in NAD Head:  Normocephalic and atraumatic. Neck:  Supple; no masses or thyromegaly. Lungs:  Clear throughout to auscultation.    Heart:  Regular rate and rhythm. Abdomen:  Soft, nontender and nondistended. Normal bowel sounds, without guarding, and without rebound.   Neurologic:  Alert and  oriented x4;  grossly normal neurologically.  Impression/Plan: Elizabeth Blackwell is here for an colonoscopy to be performed for h/o colon polyps  Risks, benefits, limitations, and alternatives regarding  colonoscopy have been reviewed with the patient.  Questions have been answered.  All parties agreeable.   Lannette Donath, MD  12/11/2021, 8:40 AM

## 2021-12-11 NOTE — Op Note (Signed)
[facility] Patient Name: Elizabeth Blackwell Procedure Date: 12/11/2021 9:27 AM MRN: [id number] Account #: [id number] Date of Birth: 04-17-1951 Admit Type: Outpatient Age: 71 Room: [facility] ENDO ROOM 4 Gender: Female Note Status: Finalized Instrument Name: Jasper Riling [id number] Procedure:             Colonoscopy Indications:           Screening in patient at increased risk: Colorectal                         cancer in brother before age 67, Last colonoscopy 10                         years ago Providers:             Lin Landsman MD, MD Referring MD:          Forest Gleason Md, MD (Referring MD) Medicines:             General Anesthesia Complications:         No immediate complications. Estimated blood loss: None. Procedure:             Pre-Anesthesia Assessment:                        - Prior to the procedure, a History and Physical was                         performed, and patient medications and allergies were                         reviewed. The patient is competent. The risks and                         benefits of the procedure and the sedation options and                         risks were discussed with the patient. All questions                         were answered and informed consent was obtained.                         Patient identification and proposed procedure were                         verified by the physician, the nurse, the                         anesthesiologist, the anesthetist and the technician                         in the pre-procedure area in the procedure room in the                         endoscopy suite. Mental Status Examination: alert and                         oriented. Airway Examination: normal oropharyngeal  airway and neck mobility. Respiratory Examination:                         clear to auscultation. CV Examination: normal.                         Prophylactic Antibiotics: The patient  does not require                         prophylactic antibiotics. Prior Anticoagulants: The                         patient has taken no previous anticoagulant or                         antiplatelet agents. ASA Grade Assessment: III - A                         patient with severe systemic disease. After reviewing                         the risks and benefits, the patient was deemed in                         satisfactory condition to undergo the procedure. The                         anesthesia plan was to use general anesthesia.                         Immediately prior to administration of medications,                         the patient was re-assessed for adequacy to receive                         sedatives. The heart rate, respiratory rate, oxygen                         saturations, blood pressure, adequacy of pulmonary                         ventilation, and response to care were monitored                         throughout the procedure. The physical status of the                         patient was re-assessed after the procedure.                        After obtaining informed consent, the colonoscope was                         passed under direct vision. Throughout the procedure,                         the patient's blood pressure, pulse, and oxygen  saturations were monitored continuously. The                         Colonoscope was introduced through the anus and                         advanced to the the cecum, identified by appendiceal                         orifice and ileocecal valve. The colonoscopy was                         performed without difficulty. The patient tolerated                         the procedure well. The quality of the bowel                         preparation was evaluated using the BBPS [facility] Bowel                         Preparation Scale) with scores of: Right Colon = 3,                         Transverse Colon = 3  and Left Colon = 3 (entire mucosa                         seen well with no residual staining, small fragments                         of stool or opaque liquid). The total BBPS score                         equals 9. Findings:      The perianal and digital rectal examinations were normal. Pertinent       negatives include normal sphincter tone and no palpable rectal lesions.      The colon (entire examined portion) appeared normal.      The retroflexed view of the distal rectum and anal verge was normal and       showed no anal or rectal abnormalities. Impression:            - The entire examined colon is normal.                        - The distal rectum and anal verge are normal on                         retroflexion view.                        - No specimens collected. Recommendation:        - Discharge patient to home (with escort).                        - Resume previous diet today.                        - Continue present  medications.                        - Repeat colonoscopy in 5 years for screening purposes                         given family h/o colon cancer. Procedure Code(s):     --- Professional ---                        W2376, Colorectal cancer screening; colonoscopy on                         individual at high risk Diagnosis Code(s):     --- Professional ---                        Z80.0, Family history of malignant neoplasm of                         digestive organs CPT copyright 2019 American Medical Association. All rights reserved. The codes documented in this report are preliminary and upon coder review may  be revised to meet current compliance requirements. Dr. Libby Maw Toney Reil MD, MD 12/11/2021 10:14:04 AM This report has been signed electronically. Number of Addenda: 0 Note Initiated On: 12/11/2021 9:27 AM Scope Withdrawal Time: 0 hours 8 minutes 4 seconds  Total Procedure Duration: 0 hours 12 minutes 53 seconds  Estimated Blood Loss:   Estimated blood loss: none.      [facility]

## 2021-12-12 ENCOUNTER — Encounter: Payer: Self-pay | Admitting: Gastroenterology

## 2022-01-03 ENCOUNTER — Other Ambulatory Visit: Payer: Self-pay

## 2022-01-03 ENCOUNTER — Encounter: Payer: Self-pay | Admitting: Nurse Practitioner

## 2022-01-03 ENCOUNTER — Ambulatory Visit: Payer: Self-pay

## 2022-01-03 ENCOUNTER — Ambulatory Visit (INDEPENDENT_AMBULATORY_CARE_PROVIDER_SITE_OTHER): Payer: Medicare HMO | Admitting: Nurse Practitioner

## 2022-01-03 VITALS — BP 132/84 | HR 90 | Temp 97.6°F | Resp 16 | Ht 60.0 in | Wt 198.3 lb

## 2022-01-03 DIAGNOSIS — M542 Cervicalgia: Secondary | ICD-10-CM

## 2022-01-03 MED ORDER — TIZANIDINE HCL 4 MG PO TABS: 4.0000 mg | ORAL_TABLET | Freq: Three times a day (TID) | ORAL | 0 refills | Status: DC | PRN

## 2022-01-03 MED ORDER — NAPROXEN 500 MG PO TABS: 500.0000 mg | ORAL_TABLET | Freq: Two times a day (BID) | ORAL | 0 refills | Status: DC

## 2022-01-03 NOTE — Progress Notes (Signed)
   BP 132/84   Pulse 90   Temp 97.6 F (36.4 C) (Oral)   Resp 16   Ht 5' (1.524 m)   Wt 198 lb 4.8 oz (89.9 kg)   SpO2 96%   BMI 38.73 kg/m    Subjective:    Patient ID: Elizabeth Blackwell, female    DOB: 1950-08-15, 71 y.o.   MRN: [id number]  HPI: Elizabeth Blackwell is a 71 y.o. female  Chief Complaint  Patient presents with   Neck Pain   Neck pain: she says she has had posterior right-sided neck pain since last night.  Patient describes the pain as tightness and sore. states she has had this pain before.  Denies any new trauma.  Upon assessment right side upper trapezius muscle very tight.  Discussed using heat therapy at home.  She can also use Biofreeze or IcyHot.  We will send in prescription for naproxen and tizanidine.  Relevant past medical, surgical, family and social history reviewed and updated as indicated. Interim medical history since our last visit reviewed. Allergies and medications reviewed and updated.  Review of Systems  Constitutional: Negative for fever or weight change.  Respiratory: Negative for cough and shortness of breath.   Cardiovascular: Negative for chest pain or palpitations.  Gastrointestinal: Negative for abdominal pain, no bowel changes.  Musculoskeletal: Negative for gait problem or joint swelling.  Positive for right neck pain Skin: Negative for rash.  Neurological: Negative for dizziness or headache.  No other specific complaints in a complete review of systems (except as listed in HPI above).      Objective:    BP 132/84   Pulse 90   Temp 97.6 F (36.4 C) (Oral)   Resp 16   Ht 5' (1.524 m)   Wt 198 lb 4.8 oz (89.9 kg)   SpO2 96%   BMI 38.73 kg/m   Wt Readings from Last 3 Encounters:  01/03/22 198 lb 4.8 oz (89.9 kg)  12/11/21 199 lb (90.3 kg)  10/16/21 206 lb 9.6 oz (93.7 kg)    Physical Exam  Constitutional: Patient appears well-developed and well-nourished. Obese  No distress.  HEENT: head atraumatic, normocephalic,  pupils equal and reactive to light, neck supple Cardiovascular: Normal rate, regular rhythm and normal heart sounds.  No murmur heard. No BLE edema. Pulmonary/Chest: Effort normal and breath sounds normal. No respiratory distress. Abdominal: Soft.  There is no tenderness. MSK: Tenderness noted on the right upper trapezius muscle. Psychiatric: Patient has a normal mood and affect. behavior is normal. Judgment and thought content normal.  Results for orders placed or performed during the hospital encounter of 12/11/21  Glucose, capillary  Result Value Ref Range   Glucose-Capillary 189 (H) 70 - 99 mg/dL      Assessment & Plan:   Problem List Items Addressed This Visit   None Visit Diagnoses     Neck pain    -  Primary   May use heat therapy.  Can also use IcyHot or Biofreeze.  We will send in naproxen and tizanidine for pain.   Relevant Medications   naproxen (NAPROSYN) 500 MG tablet   tiZANidine (ZANAFLEX) 4 MG tablet        Follow up plan: Return if symptoms worsen or fail to improve.

## 2022-01-03 NOTE — Telephone Encounter (Signed)
     Chief Complaint: Neck pain, radiates into right shoulder Symptoms: PAIN, stiffness Frequency: 1 month but getting worse Pertinent Negatives: Patient denies weakness Disposition: [] ED /[] Urgent Care (no appt availability in office) / [x] Appointment(In office/virtual)/ []  [facility]/ [] Home Care/ [] Refused Recommended Disposition /[] [facility]/ []  Follow-up with PCP Additional Notes:   Reason for Disposition  [1] SEVERE neck pain (e.g., excruciating, unable to do any normal activities) AND [2] not improved after 2 hours of pain medicine  Answer Assessment - Initial Assessment Questions 1. ONSET: "When did the pain begin?"      1 month 2. LOCATION: "Where does it hurt?"      Back of neck and into right shoulder 3. PATTERN "Does the pain come and go, or has it been constant since it started?"      Constant 4. SEVERITY: "How bad is the pain?"  (Scale 1-10; or mild, moderate, severe)   - NO PAIN (0): no pain or only slight stiffness    - MILD (1-3): doesn't interfere with normal activities    - MODERATE (4-7): interferes with normal activities or awakens from sleep    - SEVERE (8-10):  excruciating pain, unable to do any normal activities      Now - 5 5. RADIATION: "Does the pain go anywhere else, shoot into your arms?"     Shoulder 6. CORD SYMPTOMS: "Any weakness or numbness of the arms or legs?"     No 7. CAUSE: "What do you think is causing the neck pain?"     Unsure 8. NECK OVERUSE: "Any recent activities that involved turning or twisting the neck?"     No 9. OTHER SYMPTOMS: "Do you have any other symptoms?" (e.g., headache, fever, chest pain, difficulty breathing, neck swelling)     Headache yesterday 10. PREGNANCY: "Is there any chance you are pregnant?" "When was your last menstrual period?"       No  Protocols used: Neck Pain or Stiffness-A-AH

## 2022-03-08 ENCOUNTER — Encounter: Payer: Self-pay | Admitting: Nurse Practitioner

## 2022-03-08 ENCOUNTER — Ambulatory Visit (INDEPENDENT_AMBULATORY_CARE_PROVIDER_SITE_OTHER): Payer: Medicare HMO | Admitting: Nurse Practitioner

## 2022-03-08 ENCOUNTER — Other Ambulatory Visit: Payer: Self-pay

## 2022-03-08 ENCOUNTER — Encounter: Payer: Self-pay | Admitting: Emergency Medicine

## 2022-03-08 VITALS — BP 132/84 | HR 88 | Temp 98.2°F | Resp 16 | Ht 60.0 in | Wt 198.1 lb

## 2022-03-08 DIAGNOSIS — G8929 Other chronic pain: Secondary | ICD-10-CM | POA: Diagnosis not present

## 2022-03-08 DIAGNOSIS — M5442 Lumbago with sciatica, left side: Secondary | ICD-10-CM

## 2022-03-08 DIAGNOSIS — Z79899 Other long term (current) drug therapy: Secondary | ICD-10-CM | POA: Diagnosis not present

## 2022-03-08 DIAGNOSIS — E119 Type 2 diabetes mellitus without complications: Secondary | ICD-10-CM | POA: Insufficient documentation

## 2022-03-08 DIAGNOSIS — I10 Essential (primary) hypertension: Secondary | ICD-10-CM | POA: Diagnosis not present

## 2022-03-08 DIAGNOSIS — M47816 Spondylosis without myelopathy or radiculopathy, lumbar region: Secondary | ICD-10-CM | POA: Diagnosis not present

## 2022-03-08 DIAGNOSIS — M5441 Lumbago with sciatica, right side: Secondary | ICD-10-CM | POA: Diagnosis not present

## 2022-03-08 DIAGNOSIS — M1612 Unilateral primary osteoarthritis, left hip: Secondary | ICD-10-CM | POA: Diagnosis not present

## 2022-03-08 DIAGNOSIS — M25552 Pain in left hip: Secondary | ICD-10-CM | POA: Diagnosis not present

## 2022-03-08 LAB — POCT URINALYSIS DIPSTICK
Bilirubin, UA: NEGATIVE
Blood, UA: NEGATIVE
Glucose, UA: NEGATIVE
Ketones, UA: NEGATIVE
Leukocytes, UA: NEGATIVE
Nitrite, UA: NEGATIVE
Protein, UA: NEGATIVE
Spec Grav, UA: 1.02 (ref 1.010–1.025)
Urobilinogen, UA: 0.2 E.U./dL
pH, UA: 5 (ref 5.0–8.0)

## 2022-03-08 MED ORDER — PREDNISONE 10 MG (21) PO TBPK: ORAL_TABLET | ORAL | 0 refills | Status: DC

## 2022-03-08 MED ORDER — METHOCARBAMOL 500 MG PO TABS: 500.0000 mg | ORAL_TABLET | Freq: Two times a day (BID) | ORAL | 0 refills | Status: DC | PRN

## 2022-03-08 NOTE — Progress Notes (Signed)
BP 132/84   Pulse 88   Temp 98.2 F (36.8 C) (Oral)   Resp 16   Ht 5' (1.524 m)   Wt 198 lb 1.6 oz (89.9 kg)   SpO2 98%   BMI 38.69 kg/m    Subjective:    Patient ID: Elizabeth Blackwell, female    DOB: 25-Jul-1950, 72 y.o.   MRN: [id number]  HPI: Elizabeth Blackwell is a 70 y.o. female  Chief Complaint  Patient presents with   Back Pain    For 2 weeks. HX of back surgery   Low back pain :  patient reports she has had low back pain for about two weeks. She says the pain radiates down both legs.  She says she has a history of back surgery and usually takes ibuprofen for pain. Patient states that she has tried heat, ice, tylenol, advil.   Patient states when she has flares every once in awhile. Patient denies any  lower extremity numbness.  She also denies any incontinence of bowel or urine. She denies any new trauma.  She last saw ortho for her back on 06/30/2018.  She saw Dr.Gaines orthopedic surgery [facility]. She denies any urinary complaints or fever.  Will treat patient with steroid taper and muscle relaxer if no improvement recommend patient go back to orthopedic.   Relevant past medical, surgical, family and social history reviewed and updated as indicated. Interim medical history since our last visit reviewed. Allergies and medications reviewed and updated.  Review of Systems  Constitutional: Negative for fever or weight change.  Respiratory: Negative for cough and shortness of breath.   Cardiovascular: Negative for chest pain or palpitations.  Gastrointestinal: Negative for abdominal pain, no bowel changes.  Musculoskeletal: Negative for gait problem or joint swelling. Positive for back pain Skin: Negative for rash.  Neurological: Negative for dizziness or headache.  No other specific complaints in a complete review of systems (except as listed in HPI above).      Objective:    BP 132/84   Pulse 88   Temp 98.2 F (36.8 C) (Oral)   Resp 16   Ht 5' (1.524 m)    Wt 198 lb 1.6 oz (89.9 kg)   SpO2 98%   BMI 38.69 kg/m   Wt Readings from Last 3 Encounters:  03/08/22 198 lb 1.6 oz (89.9 kg)  01/03/22 198 lb 4.8 oz (89.9 kg)  12/11/21 199 lb (90.3 kg)    Physical Exam  Constitutional: Patient appears well-developed and well-nourished. Obese  No distress.  HEENT: head atraumatic, normocephalic, pupils equal and reactive to light,  neck supple Cardiovascular: Normal rate, regular rhythm and normal heart sounds.  No murmur heard. No BLE edema. Pulmonary/Chest: Effort normal and breath sounds normal. No respiratory distress. Abdominal: Soft.  There is no tenderness. MSK: midline low back tenderness, positive straight leg test Psychiatric: Patient has a normal mood and affect. behavior is normal. Judgment and thought content normal.  Results for orders placed or performed in visit on 03/08/22  POCT urinalysis dipstick  Result Value Ref Range   Color, UA yellow    Clarity, UA clear    Glucose, UA Negative Negative   Bilirubin, UA negative    Ketones, UA negative    Spec Grav, UA 1.020 1.010 - 1.025   Blood, UA negative    pH, UA 5.0 5.0 - 8.0   Protein, UA Negative Negative   Urobilinogen, UA 0.2 0.2 or 1.0 E.U./dL   Nitrite, UA  negative    Leukocytes, UA Negative Negative   Appearance clear    Odor none       Assessment & Plan:   Problem List Items Addressed This Visit       Nervous and Auditory   Chronic right-sided low back pain with right-sided sciatica - Primary    urine dip negative, start steroid taper, take muscle relaxer if no improvement recommend following up with your orthopedic. Continue heat therapy      Relevant Medications   methocarbamol (ROBAXIN) 500 MG tablet   predniSONE (STERAPRED UNI-PAK 21 TAB) 10 MG (21) TBPK tablet     Follow up plan: Return if symptoms worsen or fail to improve.

## 2022-03-08 NOTE — Assessment & Plan Note (Signed)
urine dip negative, start steroid taper, take muscle relaxer if no improvement recommend following up with your orthopedic. Continue heat therapy

## 2022-03-08 NOTE — ED Triage Notes (Signed)
Pt presents via POV with complaints of lower back pain that radiates to her left hip that started tonight. Rates the pain 10/10 and its consistent when standing and sitting. She notes taking prednisone & Robaxin PTA without any improvement. Denies CP or SOB.

## 2022-03-09 ENCOUNTER — Encounter: Payer: Self-pay | Admitting: Nurse Practitioner

## 2022-03-09 ENCOUNTER — Emergency Department
Admission: EM | Admit: 2022-03-09 | Discharge: 2022-03-09 | Disposition: A | Discharge status: Home / Self Care | Payer: Medicare HMO | Attending: Emergency Medicine | Admitting: Emergency Medicine

## 2022-03-09 ENCOUNTER — Emergency Department: Payer: Medicare HMO

## 2022-03-09 DIAGNOSIS — M1612 Unilateral primary osteoarthritis, left hip: Secondary | ICD-10-CM | POA: Diagnosis not present

## 2022-03-09 DIAGNOSIS — M47816 Spondylosis without myelopathy or radiculopathy, lumbar region: Secondary | ICD-10-CM | POA: Diagnosis not present

## 2022-03-09 DIAGNOSIS — M25552 Pain in left hip: Secondary | ICD-10-CM

## 2022-03-09 MED ORDER — ONDANSETRON 4 MG PO TBDP: 4.0000 mg | ORAL_TABLET | Freq: Four times a day (QID) | ORAL | 0 refills | Status: DC | PRN

## 2022-03-09 MED ORDER — OXYCODONE-ACETAMINOPHEN 5-325 MG PO TABS: 1.0000 | ORAL_TABLET | Freq: Four times a day (QID) | ORAL | 0 refills | Status: DC | PRN

## 2022-03-09 MED ORDER — LIDOCAINE 5 % EX PTCH
1.0000 | MEDICATED_PATCH | Freq: Once | CUTANEOUS | Status: DC
  Administered 2022-03-09: 1 via TRANSDERMAL
  Filled 2022-03-09: qty 1

## 2022-03-09 MED ORDER — OXYCODONE-ACETAMINOPHEN 5-325 MG PO TABS
1.0000 | ORAL_TABLET | Freq: Once | ORAL | Status: AC
  Administered 2022-03-09: 1 via ORAL
  Filled 2022-03-09: qty 1

## 2022-03-09 MED ORDER — LIDOCAINE 5 % EX PTCH: 1.0000 | MEDICATED_PATCH | CUTANEOUS | 0 refills | Status: DC

## 2022-03-09 MED ORDER — KETOROLAC TROMETHAMINE 30 MG/ML IJ SOLN
30.0000 mg | Freq: Once | INTRAMUSCULAR | Status: AC
  Administered 2022-03-09: 30 mg via INTRAMUSCULAR
  Filled 2022-03-09: qty 1

## 2022-03-09 MED ORDER — ONDANSETRON 4 MG PO TBDP
4.0000 mg | ORAL_TABLET | Freq: Once | ORAL | Status: AC
  Administered 2022-03-09: 4 mg via ORAL
  Filled 2022-03-09: qty 1

## 2022-03-09 NOTE — ED Notes (Signed)
Charge RN discharges pt. PT received discharge papers and verbalized understanding of discharge instructions. Pt ambulates to exit with steady gait

## 2022-03-09 NOTE — ED Provider Notes (Signed)
[facility] Provider Note    Event Date/Time   First MD Initiated Contact with Patient 03/09/22 0158     (approximate)   History   Hip Pain   HPI  Elizabeth Blackwell is a 71 y.o. female with history of hypertension, diabetes who presents to the emergency department several days of left hip pain.  No numbness, tingling, weakness, bowel or bladder incontinence, urinary retention, fever, injury.  She is able to ambulate but it causes pain.  She does not have an orthopedist.  Saw her primary care doctor and was given a prescription for prednisone Dosepak and Robaxin without much relief.   History provided by patient.    Past Medical History:  Diagnosis Date   Diabetes mellitus without complication ([place])    Hypertension     Past Surgical History:  Procedure Laterality Date   BACK SURGERY     COLONOSCOPY WITH PROPOFOL     COLONOSCOPY WITH PROPOFOL N/A 12/11/2021   Procedure: COLONOSCOPY WITH PROPOFOL;  Surgeon: Lin Landsman, MD;  Location: [facility] ENDOSCOPY;  Service: Gastroenterology;  Laterality: N/A;    MEDICATIONS:  Prior to Admission medications   Medication Sig Start Date End Date Taking? Authorizing Provider  ibuprofen (ADVIL) 800 MG tablet Take 1 tablet (800 mg total) by mouth 2 (two) times daily as needed for moderate pain. 10/16/21   Bo Merino, FNP  linagliptin (TRADJENTA) 5 MG TABS tablet Take 5 mg by mouth daily.    [provider]  lisinopril (ZESTRIL) 40 MG tablet Take 1 tablet (40 mg total) by mouth daily. 10/16/21   Bo Merino, FNP  melatonin 5 MG TABS Take 10 mg by mouth at bedtime.    [provider]  methocarbamol (ROBAXIN) 500 MG tablet Take 1 tablet (500 mg total) by mouth 2 (two) times daily as needed for muscle spasms. 03/08/22   Bo Merino, FNP  naproxen (NAPROSYN) 500 MG tablet Take 1 tablet (500 mg total) by mouth 2 (two) times daily with a meal. 01/03/22   Bo Merino, FNP   neomycin-polymyxin b-dexamethasone (MAXITROL) 3.5-10000-0.1 SUSP Place 1 drop into the right eye 2 (two) times daily. 01/01/22   [provider]  predniSONE (STERAPRED UNI-PAK 21 TAB) 10 MG (21) TBPK tablet Take as directed on package.  (60 mg po on day 1, 50 mg po on day 2...) 03/08/22   Bo Merino, FNP  Semaglutide (RYBELSUS) 3 MG TABS Take 3 mg by mouth daily. 10/18/21   Bo Merino, FNP  Semaglutide (RYBELSUS) 7 MG TABS Take 7 mg by mouth daily. 11/11/21   Bo Merino, FNP  terconazole (TERAZOL 7) 0.4 % vaginal cream Place 1 applicator vaginally at bedtime. 10/16/21   Bo Merino, FNP    Physical Exam   Triage Vital Signs: ED Triage Vitals  Enc Vitals Group     BP 03/08/22 2348 (!) 158/94     Pulse Rate 03/08/22 2348 98     Resp 03/08/22 2348 18     Temp 03/08/22 2348 97.7 F (36.5 C)     Temp Source 03/08/22 2348 Oral     SpO2 03/08/22 2348 98 %     Weight 03/08/22 2346 198 lb (89.8 kg)     Height 03/08/22 2346 5' (1.524 m)     Head Circumference --      Peak Flow --      Pain Score 03/08/22 2346 10     Pain  Loc --      Pain Edu? --      Excl. in GC? --     Most recent vital signs: Vitals:   03/08/22 2348 03/09/22 0300  BP: (!) 158/94 (!) 150/87  Pulse: 98 85  Resp: 18 16  Temp: 97.7 F (36.5 C) 97.7 F (36.5 C)  SpO2: 98% 98%    CONSTITUTIONAL: Alert and oriented and responds appropriately to questions. Well-appearing; well-nourished HEAD: Normocephalic, atraumatic EYES: Conjunctivae clear, pupils appear equal, sclera nonicteric ENT: normal nose; moist mucous membranes NECK: Supple, normal ROM CARD: RRR; S1 and S2 appreciated; no murmurs, no clicks, no rubs, no gallops RESP: Normal chest excursion without splinting or tachypnea; breath sounds clear and equal bilaterally; no wheezes, no rhonchi, no rales, no hypoxia or respiratory distress, speaking full sentences ABD/GI: Normal bowel sounds; non-distended; soft, non-tender, no rebound, no  guarding, no peritoneal signs BACK: The back appears normal EXT: Patient has tenderness over the left posterior lateral hip without soft tissue swelling, redness or warmth.  She is able to flex and extend the hip normally but has pain with internal and external rotation.  2+ left DP pulse.  Compartments soft.  Normal capillary refill.  Normal sensation. SKIN: Normal color for age and race; warm; no rash on exposed skin NEURO: Moves all extremities equally, normal speech, able to ambulate with antalgic gait, no saddle anesthesia, normal sensation diffusely, no clonus or hyperreflexia PSYCH: The patient's mood and manner are appropriate.   ED Results / Procedures / Treatments   LABS: (all labs ordered are listed, but only abnormal results are displayed) Labs Reviewed - No data to display   EKG:  EKG Interpretation  Date/Time:    Ventricular Rate:    PR Interval:    QRS Duration:   QT Interval:    QTC Calculation:   R Axis:     Text Interpretation:           RADIOLOGY: My personal review and interpretation of imaging: X-ray of the left hip shows degenerative changes but no other acute abnormality.  I have personally reviewed all radiology reports.   DG Hip Unilat W or Wo Pelvis 2-3 Views Left  Result Date: 03/09/2022 CLINICAL DATA:  Low back pain radiating to the left hip tonight. EXAM: DG HIP (WITH OR WITHOUT PELVIS) 2-3V LEFT COMPARISON:  None Available. FINDINGS: Degenerative changes in the lower lumbar spine and hips. Pelvis and left hip appear intact. No evidence of acute fracture or dislocation. No focal bone lesion or bone destruction. Soft tissues are unremarkable. 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  No acute bony abnormalities. Electronically Signed   By: Burman Nieves M.D.   On: 03/09/2022 00:26     PROCEDURES:  Critical Care performed: No      Procedures    IMPRESSION / MDM / ASSESSMENT AND PLAN / ED COURSE  I reviewed the triage vital  signs and the nursing notes.    Patient here with complaints of left hip pain ongoing for 2 weeks.  History intermittently of the same.  No focal neurodeficits, back pain.     DIFFERENTIAL DIAGNOSIS (includes but not limited to):   Arthritis, tendinitis, doubt septic arthritis or gout.  Differential also includes sciatica.  Doubt epidural abscess, hematoma, discitis or osteomyelitis, transverse myelitis, cauda equina, spinal fracture.   Patient's presentation is most consistent with acute complicated illness / injury requiring diagnostic workup.   PLAN: X-rays of the left hip were obtained from triage  and reviewed and interpreted by myself and the radiologist and shows arthritis but no acute abnormality.  Patient is neurovascular intact distally.  She has no red flag symptoms or neurodeficits to suggest a spinal emergency.  She has pain with internal/external rotation of the hip.  I suspect that the pain is coming from the hip itself discussed with patient that she will need orthopedic follow-up for possible MRI as an outpatient.  She is on muscle relaxers without any relief and just started steroids today.  We will give her a Lidoderm patch here for pain control and an IM injection of Toradol.  Will discharge with prescriptions of oxycodone for pain control.  Discussed rest, elevation, ice, stretching.   MEDICATIONS GIVEN IN ED: Medications  lidocaine (LIDODERM) 5 % 1 patch (1 patch Transdermal Patch Applied 03/09/22 0252)  oxyCODONE-acetaminophen (PERCOCET/ROXICET) 5-325 MG per tablet 1 tablet (1 tablet Oral Provided for home use 03/09/22 0252)  ondansetron (ZOFRAN-ODT) disintegrating tablet 4 mg (4 mg Oral Given 03/09/22 0252)  ketorolac (TORADOL) 30 MG/ML injection 30 mg (30 mg Intramuscular Given 03/09/22 0252)     ED COURSE:  At this time, I do not feel there is any life-threatening condition present. I reviewed all nursing notes, vitals, pertinent previous records.  All lab and  urine results, EKGs, imaging ordered have been independently reviewed and interpreted by myself.  I reviewed all available radiology reports from any imaging ordered this visit.  Based on my assessment, I feel the patient is safe to be discharged home without further emergent workup and can continue workup as an outpatient as needed. Discussed all findings, treatment plan as well as usual and customary return precautions.  They verbalize understanding and are comfortable with this plan.  Outpatient follow-up has been provided as needed.  All questions have been answered.    CONSULTS:  none   OUTSIDE RECORDS REVIEWED: Reviewed patient's last podiatry note on 11/30/2021.       FINAL CLINICAL IMPRESSION(S) / ED DIAGNOSES   Final diagnoses:  Left hip pain     Rx / DC Orders   ED Discharge Orders          Ordered    oxyCODONE-acetaminophen (PERCOCET) 5-325 MG tablet  Every 6 hours PRN        03/09/22 0231    ondansetron (ZOFRAN-ODT) 4 MG disintegrating tablet  Every 6 hours PRN        03/09/22 0231    lidocaine (LIDODERM) 5 %  Every 24 hours        03/09/22 0450             Note:  This document was prepared using Dragon voice recognition software and may include unintentional dictation errors.   Daniella Dewberry, Layla Maw, DO 03/09/22 [phone]

## 2022-03-09 NOTE — Discharge Instructions (Addendum)

## 2022-03-12 ENCOUNTER — Other Ambulatory Visit: Payer: Self-pay | Admitting: Nurse Practitioner

## 2022-03-12 ENCOUNTER — Telehealth: Payer: Self-pay | Admitting: Nurse Practitioner

## 2022-03-12 DIAGNOSIS — G8929 Other chronic pain: Secondary | ICD-10-CM

## 2022-03-12 NOTE — Telephone Encounter (Signed)
Do you need to see her 1st or can you put in referral

## 2022-03-12 NOTE — Telephone Encounter (Signed)
Patient notified

## 2022-03-12 NOTE — Telephone Encounter (Signed)
Transition Care Management Follow-up Telephone Call Date of discharge and from where: 03/09/22 from [facility] How have you been since you were released from the hospital? good Any questions or concerns? No  Items Reviewed: Did the pt receive and understand the discharge instructions provided? Yes  Medications obtained and verified? Yes  Other? No  Any new allergies since your discharge? No  Dietary orders reviewed? N/A Do you have support at home? No   Home Care and Equipment/Supplies: Were home health services ordered? not applicable If so, what is the name of the agency? N/A  Has the agency set up a time to come to the patient's home? not applicable Were any new equipment or medical supplies ordered?  No What is the name of the medical supply agency? N/A Were you able to get the supplies/equipment? not applicable Do you have any questions related to the use of the equipment or supplies? N/A  Functional Questionnaire: (I = Independent and D = Dependent) ADLs: I  Bathing/Dressing- I  Meal Prep- I  Eating- I  Maintaining continence- I  Transferring/Ambulation- I  Managing Meds- I  Follow up appointments reviewed:  PCP Hospital f/u appt confirmed?  [facility] f/u appt confirmed? No , pt would like a referral to see Dr. Armandina Gemma (orthopedic specialist) Are transportation arrangements needed? No  If their condition worsens, is the pt aware to call PCP or go to the Emergency Dept.? Yes Was the patient provided with contact information for the PCP's office or ED? Yes Was to pt encouraged to call back with questions or concerns? Yes

## 2022-03-16 DIAGNOSIS — M5416 Radiculopathy, lumbar region: Secondary | ICD-10-CM | POA: Diagnosis not present

## 2022-03-16 DIAGNOSIS — M545 Low back pain, unspecified: Secondary | ICD-10-CM | POA: Diagnosis not present

## 2022-03-20 ENCOUNTER — Other Ambulatory Visit: Payer: Self-pay | Admitting: Emergency Medicine

## 2022-03-20 ENCOUNTER — Other Ambulatory Visit: Payer: Self-pay | Admitting: Nurse Practitioner

## 2022-03-20 ENCOUNTER — Telehealth: Payer: Self-pay | Admitting: Nurse Practitioner

## 2022-03-20 DIAGNOSIS — E119 Type 2 diabetes mellitus without complications: Secondary | ICD-10-CM

## 2022-03-20 MED ORDER — BLOOD GLUCOSE MONITOR KIT: PACK | 0 refills | Status: DC

## 2022-03-20 NOTE — Telephone Encounter (Signed)
Pts meter has broken and she needs a new RX fro Accu chek [facility] / please send to [place] advise

## 2022-03-20 NOTE — Telephone Encounter (Signed)
Order sent to [facility]

## 2022-03-29 NOTE — Telephone Encounter (Signed)
Patient notified script was sent on 11/7. She will check pharmacy.  Please call and make patient appointment to follow up with [facility]

## 2022-03-29 NOTE — Telephone Encounter (Signed)
Pt called seeking update on request, says she is frustrated that she has not heard from the clinic sooner. Please advise

## 2022-03-29 NOTE — Telephone Encounter (Signed)
Pt already has an appt sch'd for 12.6, does she need to be seen sooner?

## 2022-04-01 ENCOUNTER — Other Ambulatory Visit: Payer: Self-pay | Admitting: Nurse Practitioner

## 2022-04-02 ENCOUNTER — Other Ambulatory Visit: Payer: Self-pay | Admitting: Emergency Medicine

## 2022-04-02 DIAGNOSIS — E119 Type 2 diabetes mellitus without complications: Secondary | ICD-10-CM

## 2022-04-02 MED ORDER — BLOOD GLUCOSE MONITOR KIT: PACK | 0 refills | Status: DC

## 2022-04-02 NOTE — Telephone Encounter (Signed)
Requested medication (s) are due for refill today: -  Requested medication (s) are on the active medication list: historical med  Last refill:  12/11/21  Future visit scheduled: yes  Notes to clinic:  historical provider   Requested Prescriptions  Pending Prescriptions Disp Refills   TRADJENTA 5 MG TABS tablet [Pharmacy Med Name: TRADJENTA 5 MG Tablet] 90 tablet 10    Sig: TAKE 1 TABLET EVERY DAY     Endocrinology:  Diabetes - DPP-4 Inhibitors - linagliptin Failed - 04/01/2022  4:22 AM      Failed - HBA1C is between 0 and 7.9 and within 180 days    Hemoglobin A1C  Date Value Ref Range Status  02/20/2021 7.5  Final   Hgb A1c MFr Bld  Date Value Ref Range Status  10/16/2021 13.6 (H) <5.7 % of total Hgb Final    Comment:    For someone without known diabetes, a hemoglobin A1c value of 6.5% or greater indicates that they may have  diabetes and this should be confirmed with a follow-up  test. . For someone with known diabetes, a value <7% indicates  that their diabetes is well controlled and a value  greater than or equal to 7% indicates suboptimal  control. A1c targets should be individualized based on  duration of diabetes, age, comorbid conditions, and  other considerations. . Currently, no consensus exists regarding use of hemoglobin A1c for diagnosis of diabetes for children. Verna Czech - Valid encounter within last 6 months    Recent Outpatient Visits           3 weeks ago Chronic bilateral low back pain with bilateral sciatica   [facility] Berniece Salines, FNP   2 months ago Neck pain   [facility] Berniece Salines, FNP   5 months ago Essential hypertension   [facility] Berniece Salines, FNP   7 months ago Viral upper respiratory tract infection   [facility] Berniece Salines, FNP   9 months ago Essential hypertension   [facility] Berniece Salines, FNP        Future Appointments             In 2 weeks Zane Herald, Rudolpho Sevin, FNP [facility]

## 2022-04-15 NOTE — Progress Notes (Deleted)
There were no vitals taken for this visit.   Subjective:    Patient ID: Elizabeth Blackwell, female    DOB: Jan 12, 1951, 71 y.o.   MRN: [id number]  HPI: Elizabeth Blackwell is a 71 y.o. female  No chief complaint on file.  HTN: She is currently taking lisinopril 40 mg daily.  Her blood pressure today is ***.  She denies any blurred vision, headaches, chest pain or shortness of breath.    DM2: Her last A1C was 13.6 on 10/16/2021.  She is currently taking ***.    Hyperlipidemia: Her last LDL was 120 on 10/16/2021.  She is not currently on cholesterol medication.   The 10-year ASCVD risk score (Arnett DK, et al., 2019) is: 30.6%   Values used to calculate the score:     Age: 69 years     Sex: Female     Is Non-Hispanic African American: Yes     Diabetic: Yes     Tobacco smoker: No     Systolic Blood Pressure: 150 mmHg     Is BP treated: Yes     HDL Cholesterol: 41 mg/dL     Total Cholesterol: 179 mg/dL   Obesity: Her weight today is *** with a BMI of ***.  Encouraged to work on eating a well balanced diet with portion control and staying physically active.   Relevant past medical, surgical, family and social history reviewed and updated as indicated. Interim medical history since our last visit reviewed. Allergies and medications reviewed and updated.  Review of Systems  Constitutional: Negative for fever or weight change.  Respiratory: Negative for cough and shortness of breath.   Cardiovascular: Negative for chest pain or palpitations.  Gastrointestinal: Negative for abdominal pain, no bowel changes.  Musculoskeletal: Negative for gait problem or joint swelling.  Positive for low back pain Skin: Negative for rash.  Neurological: Negative for dizziness or headache.  No other specific complaints in a complete review of systems (except as listed in HPI above).      Objective:    There were no vitals taken for this visit.  Wt Readings from Last 3 Encounters:  03/08/22 198 lb  (89.8 kg)  03/08/22 198 lb 1.6 oz (89.9 kg)  01/03/22 198 lb 4.8 oz (89.9 kg)    Physical Exam  Constitutional: Patient appears well-developed and well-nourished. Obese  No distress.  HEENT: head atraumatic, normocephalic, pupils equal and reactive to light,  neck supple Cardiovascular: Normal rate, regular rhythm and normal heart sounds.  No murmur heard. No BLE edema. Pulmonary/Chest: Effort normal and breath sounds normal. No respiratory distress. Abdominal: Soft.  There is no tenderness. Psychiatric: Patient has a normal mood and affect. behavior is normal. Judgment and thought content normal. \  Results for orders placed or performed in visit on 03/08/22  POCT urinalysis dipstick  Result Value Ref Range   Color, UA yellow    Clarity, UA clear    Glucose, UA Negative Negative   Bilirubin, UA negative    Ketones, UA negative    Spec Grav, UA 1.020 1.010 - 1.025   Blood, UA negative    pH, UA 5.0 5.0 - 8.0   Protein, UA Negative Negative   Urobilinogen, UA 0.2 0.2 or 1.0 E.U./dL   Nitrite, UA negative    Leukocytes, UA Negative Negative   Appearance clear    Odor none       Assessment & Plan:   Problem List Items Addressed This Visit  None    Follow up plan: No follow-ups on file.

## 2022-04-16 ENCOUNTER — Ambulatory Visit: Payer: Medicare HMO | Admitting: Nurse Practitioner

## 2022-04-16 ENCOUNTER — Encounter: Payer: Self-pay | Admitting: Pharmacist

## 2022-04-16 DIAGNOSIS — E7849 Other hyperlipidemia: Secondary | ICD-10-CM

## 2022-04-16 DIAGNOSIS — I10 Essential (primary) hypertension: Secondary | ICD-10-CM

## 2022-04-16 DIAGNOSIS — E1165 Type 2 diabetes mellitus with hyperglycemia: Secondary | ICD-10-CM

## 2022-04-16 NOTE — Progress Notes (Signed)
[facility])                                            [facility] Quality Pharmacy Team                                        Statin Quality Measure Assessment    04/16/2022  Elizabeth Blackwell 1951/01/25 [id number]   I am a [facility] clinical pharmacist that reviews patients for statin quality initiatives.     Per review of chart and payor information, Ms. An has a diagnosis of diabetes but is not currently filling a statin prescription.  This places her into the SUPD (Statin Use In Patients with Diabetes) measure for CMS.    Ms. Khouri has been prescribed pravastatin in the past but has not filled since  12/2020. If clinically appropriate, please consider discussing statin therapy at today's appointment.  Her calculated 10-year ASCVD risk score (Arnett DK, et al., 2019) is: 30.6%  Please consider ONE of the following recommendations:  Initiate high intensity statin Atorvastatin 40mg  once daily, #90, 3 refills   Rosuvastatin 20mg  once daily, #90, 3 refills    Initiate moderate intensity          statin with reduced frequency if prior          statin intolerance 1x weekly, #13, 3 refills   2x weekly, #26, 3 refills   3x weekly, #39, 3 refills    Code for past statin intolerance or  other exclusions (required annually)  Provider Requirements: Associate code during an office visit or telehealth encounter  Drug Induced Myopathy G72.0   Myopathy, unspecified G72.9   Myositis, unspecified M60.9   Rhabdomyolysis M62.82   Alcoholic fatty liver K70.0   Cirrhosis of liver K74.69   Prediabetes R73.03    Thank you for your time and consideration! , PharmD [facility]  [facility] Clinical Pharmacist Office: [phone].

## 2022-04-18 ENCOUNTER — Ambulatory Visit: Payer: Medicare HMO | Admitting: Nurse Practitioner

## 2022-05-08 ENCOUNTER — Other Ambulatory Visit: Payer: Self-pay | Admitting: Nurse Practitioner

## 2022-05-08 DIAGNOSIS — I10 Essential (primary) hypertension: Secondary | ICD-10-CM

## 2022-05-10 NOTE — Telephone Encounter (Signed)
Requested Prescriptions  Pending Prescriptions Disp Refills   lisinopril (ZESTRIL) 40 MG tablet [Pharmacy Med Name: LISINOPRIL 40 MG Tablet] 90 tablet 0    Sig: TAKE 1 TABLET EVERY DAY     Cardiovascular:  ACE Inhibitors Failed - 05/08/2022  2:28 PM      Failed - Cr in normal range and within 180 days    Creat  Date Value Ref Range Status  10/16/2021 0.67 0.50 - 1.05 mg/dL Final   Creatinine, Urine  Date Value Ref Range Status  10/16/2021 113 20 - 275 mg/dL Final         Failed - K in normal range and within 180 days    Potassium  Date Value Ref Range Status  10/16/2021 4.5 3.5 - 5.3 mmol/L Final         Failed - Last BP in normal range    BP Readings from Last 1 Encounters:  03/09/22 (!) 150/87         Passed - Patient is not pregnant      Passed - Valid encounter within last 6 months    Recent Outpatient Visits           2 months ago Chronic bilateral low back pain with bilateral sciatica   [facility] Berniece Salines, FNP   4 months ago Neck pain   [facility] Berniece Salines, FNP   6 months ago Essential hypertension   [facility] Berniece Salines, FNP   8 months ago Viral upper respiratory tract infection   [facility] Berniece Salines, FNP   11 months ago Essential hypertension   [facility] Berniece Salines, [place]

## 2022-05-24 ENCOUNTER — Encounter: Payer: Self-pay | Admitting: Nurse Practitioner

## 2022-05-24 ENCOUNTER — Other Ambulatory Visit: Payer: Self-pay

## 2022-05-24 ENCOUNTER — Ambulatory Visit (INDEPENDENT_AMBULATORY_CARE_PROVIDER_SITE_OTHER): Payer: Medicare HMO | Admitting: Nurse Practitioner

## 2022-05-24 VITALS — BP 128/82 | HR 95 | Temp 97.9°F | Resp 16 | Ht 60.0 in | Wt 199.0 lb

## 2022-05-24 DIAGNOSIS — I10 Essential (primary) hypertension: Secondary | ICD-10-CM | POA: Diagnosis not present

## 2022-05-24 DIAGNOSIS — M1990 Unspecified osteoarthritis, unspecified site: Secondary | ICD-10-CM | POA: Diagnosis not present

## 2022-05-24 DIAGNOSIS — R197 Diarrhea, unspecified: Secondary | ICD-10-CM

## 2022-05-24 DIAGNOSIS — Z6841 Body Mass Index (BMI) 40.0 and over, adult: Secondary | ICD-10-CM | POA: Diagnosis not present

## 2022-05-24 DIAGNOSIS — E1165 Type 2 diabetes mellitus with hyperglycemia: Secondary | ICD-10-CM | POA: Insufficient documentation

## 2022-05-24 DIAGNOSIS — E7849 Other hyperlipidemia: Secondary | ICD-10-CM

## 2022-05-24 MED ORDER — LISINOPRIL 40 MG PO TABS: 40.0000 mg | ORAL_TABLET | Freq: Every day | ORAL | 3 refills | Status: DC

## 2022-05-24 NOTE — Assessment & Plan Note (Signed)
Recommend starting statin medication, will get labs and readdress at that time

## 2022-05-24 NOTE — Assessment & Plan Note (Signed)
Continue working on diet and exercise.

## 2022-05-24 NOTE — Progress Notes (Signed)
BP 128/82   Pulse 95   Temp 97.9 F (36.6 C) (Oral)   Resp 16   Ht 5' (1.524 m)   Wt 199 lb (90.3 kg)   SpO2 97%   BMI 38.86 kg/m    Subjective:    Patient ID: Elizabeth Blackwell, female    DOB: 1950/08/29, 72 y.o.   MRN: [id number]  HPI: Elizabeth Blackwell is a 72 y.o. female  Chief Complaint  Patient presents with   Diabetes   Hypertension   Hyperlipidemia    Follow up   HTN: Her blood pressure today is 128/82.  She currently takes lisinopril 40 mg daily.  She denies any blurred vision, chest pain, shortness of breath or headaches.    DM2: Her last A1C was 13.6 on 10/16/2021. She does not tolerate metformin.  We started her on rybelsus. She says that the rybelsus upset her stomach.  She is taking the tradgenta 5 mg daily.    She reports that she has been working on her eating habits.  She says her blood sugar this morning was 154.  Discussed we will recheck her A1C,discussed possibly starting insulin.     Hyperlipidemia: Her last LDL was 120 on 10/16/2021.  She is not currently on cholesterol lowering medication. Discussed her risk score and that I recommend she start a statin medication.  She would like to think about it. Will get labs and then readdress.  The 10-year ASCVD risk score (Arnett DK, et al., 2019) is: 23.5%   Values used to calculate the score:     Age: 13 years     Sex: Female     Is Non-Hispanic African American: Yes     Diabetic: Yes     Tobacco smoker: No     Systolic Blood Pressure: 970 mmHg     Is BP treated: Yes     HDL Cholesterol: 41 mg/dL     Total Cholesterol: 179 mg/dL   Obesity: Her weight today is 199 lbs with a BMI of 38.86.  She is working on lifestyle modification.    Diarrhea: she says she has been having a lot of gas and diarrhea.  She says this has been going on off and on since Christmas.  She says that she noticed it after eating seafood.  She denies any blood in her stool.  She says she has been doing imodium and Pepto bismol.  Will get  stool culture.   Arthritis:  she says she is still struggling with her arthritis.  She says that she really is feeling it in her hips. She does not want to take medication.  Recommend she try voltaren gel.   Relevant past medical, surgical, family and social history reviewed and updated as indicated. Interim medical history since our last visit reviewed. Allergies and medications reviewed and updated.  Review of Systems  Constitutional: Negative for fever or weight change.  Respiratory: Negative for cough and shortness of breath.   Cardiovascular: Negative for chest pain or palpitations.  Gastrointestinal: Negative for abdominal pain, no bowel changes.  Musculoskeletal: Negative for gait problem or joint swelling.  Positive for low back/hip   pain Skin: Negative for rash.  Neurological: Negative for dizziness or headache.  No other specific complaints in a complete review of systems (except as listed in HPI above).      Objective:    BP 128/82   Pulse 95   Temp 97.9 F (36.6 C) (Oral)   Resp 16  Ht 5' (1.524 m)   Wt 199 lb (90.3 kg)   SpO2 97%   BMI 38.86 kg/m   Wt Readings from Last 3 Encounters:  05/24/22 199 lb (90.3 kg)  03/08/22 198 lb (89.8 kg)  03/08/22 198 lb 1.6 oz (89.9 kg)    Physical Exam  Constitutional: Patient appears well-developed and well-nourished. Obese  No distress.  HEENT: head atraumatic, normocephalic, pupils equal and reactive to light,  neck supple Cardiovascular: Normal rate, regular rhythm and normal heart sounds.  No murmur heard. No BLE edema. Pulmonary/Chest: Effort normal and breath sounds normal. No respiratory distress. Abdominal: Soft.  There is no tenderness. Psychiatric: Patient has a normal mood and affect. behavior is normal. Judgment and thought content normal. \  Results for orders placed or performed in visit on 03/08/22  POCT urinalysis dipstick  Result Value Ref Range   Color, UA yellow    Clarity, UA clear    Glucose, UA  Negative Negative   Bilirubin, UA negative    Ketones, UA negative    Spec Grav, UA 1.020 1.010 - 1.025   Blood, UA negative    pH, UA 5.0 5.0 - 8.0   Protein, UA Negative Negative   Urobilinogen, UA 0.2 0.2 or 1.0 E.U./dL   Nitrite, UA negative    Leukocytes, UA Negative Negative   Appearance clear    Odor none       Assessment & Plan:   Problem List Items Addressed This Visit       Cardiovascular and Mediastinum   Essential hypertension    Continue taking lisinopril 40 mg daily.      Relevant Medications   lisinopril (ZESTRIL) 40 MG tablet   Other Relevant Orders   CBC with Differential/Platelet   COMPLETE METABOLIC PANEL WITH GFR     Endocrine   Uncontrolled type 2 diabetes mellitus with hyperglycemia ([place]) - Primary    Getting A1C, discussed she may need to start insulin, will readdress after labs. Continue taking tradgenta 5 mg daily      Relevant Medications   lisinopril (ZESTRIL) 40 MG tablet   Other Relevant Orders   COMPLETE METABOLIC PANEL WITH GFR   Hemoglobin A1c     Musculoskeletal and Integument   Arthritis    Try voltaren gel        Other   Morbid obesity with BMI of 40.0-44.9, adult ([place])    Continue working on diet and exercise      Other hyperlipidemia    Recommend starting statin medication, will get labs and readdress at that time      Relevant Medications   lisinopril (ZESTRIL) 40 MG tablet   Other Relevant Orders   Lipid panel   Other Visit Diagnoses     Diarrhea, unspecified type       getting stool cultures, if continues may need to sent to gi   Relevant Orders   Ova and parasite examination   Clostridium difficile culture-fecal   Salmonella/Shigella Cult, Campy EIA and Shiga Toxin reflex   Stool Giardia/Cryptosporidium        Follow up plan: Return in about 3 months (around 08/23/2022) for follow up.

## 2022-05-24 NOTE — Assessment & Plan Note (Addendum)
Getting A1C, discussed she may need to start insulin, will readdress after labs. Continue taking tradgenta 5 mg daily

## 2022-05-24 NOTE — Assessment & Plan Note (Signed)
Try voltaren gel

## 2022-05-24 NOTE — Assessment & Plan Note (Signed)
Continue taking lisinopril 40 mg daily.

## 2022-05-25 LAB — LIPID PANEL
Cholesterol: 160 mg/dL (ref <200)
HDL: 42 mg/dL — ABNORMAL LOW (ref >=50)
LDL Cholesterol (Calc): 102 mg/dL (calc) — ABNORMAL HIGH
Non-HDL Cholesterol (Calc): 118 mg/dL (calc) (ref <130)
Total CHOL/HDL Ratio: 3.8 (calc) (ref <5.0)
Triglycerides: 72 mg/dL (ref <150)

## 2022-05-25 LAB — COMPLETE METABOLIC PANEL WITH GFR
AG Ratio: 1.6 (calc) (ref 1.0–2.5)
ALT: 28 U/L (ref 6–29)
AST: 20 U/L (ref 10–35)
Albumin: 4.2 g/dL (ref 3.6–5.1)
Alkaline phosphatase (APISO): 91 U/L (ref 37–153)
BUN: 14 mg/dL (ref 7–25)
CO2: 26 mmol/L (ref 20–32)
Calcium: 9.2 mg/dL (ref 8.6–10.4)
Chloride: 106 mmol/L (ref 98–110)
Creat: 0.6 mg/dL (ref 0.60–1.00)
Globulin: 2.7 g/dL (calc) (ref 1.9–3.7)
Glucose, Bld: 163 mg/dL — ABNORMAL HIGH (ref 65–99)
Potassium: 4.5 mmol/L (ref 3.5–5.3)
Sodium: 140 mmol/L (ref 135–146)
Total Bilirubin: 0.3 mg/dL (ref 0.2–1.2)
Total Protein: 6.9 g/dL (ref 6.1–8.1)
eGFR: 97 mL/min/{1.73_m2} (ref >=60)

## 2022-05-25 LAB — CBC WITH DIFFERENTIAL/PLATELET
Absolute Monocytes: 518 cells/uL (ref 200–950)
Basophils Absolute: 32 cells/uL (ref 0–200)
Basophils Relative: 0.6 %
Eosinophils Absolute: 200 cells/uL (ref 15–500)
Eosinophils Relative: 3.7 %
HCT: 45.5 % — ABNORMAL HIGH (ref 35.0–45.0)
Hemoglobin: 14.9 g/dL (ref 11.7–15.5)
Lymphs Abs: 2241 cells/uL (ref 850–3900)
MCH: 29.3 pg (ref 27.0–33.0)
MCHC: 32.7 g/dL (ref 32.0–36.0)
MCV: 89.4 fL (ref 80.0–100.0)
MPV: 12.3 fL (ref 7.5–12.5)
Monocytes Relative: 9.6 %
Neutro Abs: 2408 cells/uL (ref 1500–7800)
Neutrophils Relative %: 44.6 %
Platelets: 184 10*3/uL (ref 140–400)
RBC: 5.09 10*6/uL (ref 3.80–5.10)
RDW: 11.9 % (ref 11.0–15.0)
Total Lymphocyte: 41.5 %
WBC: 5.4 10*3/uL (ref 3.8–10.8)

## 2022-05-25 LAB — HEMOGLOBIN A1C
Hgb A1c MFr Bld: 7.7 % of total Hgb — ABNORMAL HIGH (ref <5.7)
Mean Plasma Glucose: 174 mg/dL
eAG (mmol/L): 9.7 mmol/L

## 2022-05-28 DIAGNOSIS — R197 Diarrhea, unspecified: Secondary | ICD-10-CM | POA: Diagnosis not present

## 2022-06-03 LAB — CLOSTRIDIUM DIFFICILE CULTURE-FECAL

## 2022-06-03 LAB — GIARDIA AND CRYPTOSPORIDIUM ANTIGEN PANEL
MICRO NUMBER:: 14431957
RESULT:: NOT DETECTED
SPECIMEN QUALITY:: ADEQUATE
Specimen Quality:: ADEQUATE
micro Number:: 14431956

## 2022-06-03 LAB — SALMONELLA/SHIGELLA CULT, CAMPY EIA AND SHIGA TOXIN RFL ECOLI
MICRO NUMBER: 14434039
MICRO NUMBER:: 14434040
MICRO NUMBER:: 14434041
Result:: NOT DETECTED
SHIGA RESULT:: NOT DETECTED
SPECIMEN QUALITY: ADEQUATE
SPECIMEN QUALITY:: ADEQUATE
SPECIMEN QUALITY:: ADEQUATE

## 2022-06-03 LAB — OVA AND PARASITE EXAMINATION
CONCENTRATE RESULT:: NONE SEEN
MICRO NUMBER:: 14431958
SPECIMEN QUALITY:: ADEQUATE
TRICHROME RESULT:: NONE SEEN

## 2022-07-25 ENCOUNTER — Ambulatory Visit (INDEPENDENT_AMBULATORY_CARE_PROVIDER_SITE_OTHER): Payer: Medicare HMO | Admitting: Family Medicine

## 2022-07-25 ENCOUNTER — Encounter: Payer: Self-pay | Admitting: Family Medicine

## 2022-07-25 VITALS — BP 114/68 | HR 97 | Temp 98.3°F | Resp 16 | Ht 60.0 in | Wt 193.6 lb

## 2022-07-25 DIAGNOSIS — R194 Change in bowel habit: Secondary | ICD-10-CM

## 2022-07-25 DIAGNOSIS — R10829 Rebound abdominal tenderness, unspecified site: Secondary | ICD-10-CM

## 2022-07-25 DIAGNOSIS — R1031 Right lower quadrant pain: Secondary | ICD-10-CM

## 2022-07-25 DIAGNOSIS — R630 Anorexia: Secondary | ICD-10-CM | POA: Diagnosis not present

## 2022-07-25 LAB — POCT URINALYSIS DIPSTICK
Bilirubin, UA: NEGATIVE
Blood, UA: NEGATIVE
Glucose, UA: NEGATIVE
Ketones, UA: POSITIVE
Leukocytes, UA: NEGATIVE
Nitrite, UA: NEGATIVE
Protein, UA: NEGATIVE
Spec Grav, UA: 1.01 (ref 1.010–1.025)
Urobilinogen, UA: 2 E.U./dL — AB
pH, UA: 5 (ref 5.0–8.0)

## 2022-07-25 MED ORDER — ONDANSETRON HCL 4 MG PO TABS: 4.0000 mg | ORAL_TABLET | Freq: Three times a day (TID) | ORAL | 0 refills | Status: DC | PRN

## 2022-07-25 MED ORDER — PANTOPRAZOLE SODIUM 20 MG PO TBEC: 20.0000 mg | DELAYED_RELEASE_TABLET | ORAL | 1 refills | Status: DC

## 2022-07-25 NOTE — Addendum Note (Signed)
Addended by: Salomon Fick on: 07/25/2022 03:01 PM   Modules accepted: Orders

## 2022-07-25 NOTE — Addendum Note (Signed)
Addended by: Salomon Fick on: 07/25/2022 03:42 PM   Modules accepted: Orders

## 2022-07-25 NOTE — Addendum Note (Signed)
Addended by: Delsa Grana on: 07/25/2022 03:39 PM   Modules accepted: Orders

## 2022-07-25 NOTE — Progress Notes (Addendum)
Patient ID: Elizabeth Blackwell, female    DOB: 07-23-1950, 72 y.o.   MRN: [id number]  PCP: Bo Merino, FNP  Chief Complaint  Patient presents with   Abdominal Pain    Stomach discomfort    Subjective:   Elizabeth Blackwell is a 72 y.o. female, presents to clinic with CC of the following:  HPI  Abd pain, some constipation, decreased appetite which is abnormal for her Just can't eat what she usually does No indigestion, no burning in upper abd, no fullness or bloating, peeing normal no urgency, hematuria, frequency Pain is located across lower abdomen,  Intermittent pain is low abd without radiation, sharp comes and goes no relation to eating, positions, activity Sx started Sunday - 3-4 d ago, getting a little worse  Some bowel changes over the last couple months, was soft a while ago, then constipated metamucil made it better She's been taking cordicidin and wonders if that caused constipation, no hx of diverticilitis, recent colonoscopy done as well No urinary sx Hx hx of abd surgery   Lost some weight in the last few days Wt Readings from Last 5 Encounters:  07/25/22 193 lb 9.6 oz (87.8 kg)  05/24/22 199 lb (90.3 kg)  03/08/22 198 lb (89.8 kg)  03/08/22 198 lb 1.6 oz (89.9 kg)  01/03/22 198 lb 4.8 oz (89.9 kg)   BMI Readings from Last 5 Encounters:  07/25/22 37.81 kg/m  05/24/22 38.86 kg/m  03/08/22 38.67 kg/m  03/08/22 38.69 kg/m  01/03/22 38.73 kg/m     Patient Active Problem List   Diagnosis Date Noted   Uncontrolled type 2 diabetes mellitus with hyperglycemia ([place]) 05/24/2022   Arthritis 05/24/2022   Family history of colon cancer requiring screening colonoscopy    Chronic right-sided low back pain with right-sided sciatica 10/16/2021   Other hyperlipidemia 10/16/2021   Morbid obesity with BMI of 40.0-44.9, adult ([place]) 04/04/2016   Diabetes mellitus type II, controlled, with no complications ([place]) 123456   Essential hypertension 01/01/2007       Current Outpatient Medications:    ibuprofen (ADVIL) 800 MG tablet, Take 1 tablet (800 mg total) by mouth 2 (two) times daily as needed for moderate pain., Disp: 90 tablet, Rfl: 0   lisinopril (ZESTRIL) 40 MG tablet, Take 1 tablet (40 mg total) by mouth daily., Disp: 90 tablet, Rfl: 3   melatonin 5 MG TABS, Take 10 mg by mouth at bedtime., Disp: , Rfl:    neomycin-polymyxin b-dexamethasone (MAXITROL) 3.5-10000-0.1 SUSP, Place 1 drop into the right eye 2 (two) times daily., Disp: , Rfl:    terconazole (TERAZOL 7) 0.4 % vaginal cream, Place 1 applicator vaginally at bedtime., Disp: 45 g, Rfl: 0   TRADJENTA 5 MG TABS tablet, TAKE 1 TABLET EVERY DAY, Disp: 90 tablet, Rfl: 10   Allergies  Allergen Reactions   Codeine Phosphate     REACTION: unspecified     Social History   Tobacco Use   Smoking status: Never   Smokeless tobacco: Never  Vaping Use   Vaping Use: Never used  Substance Use Topics   Alcohol use: Yes    Alcohol/week: 1.0 standard drink of alcohol    Types: 1 Cans of beer per week   Drug use: Never      Chart Review Today: I personally reviewed active problem list, medication list, allergies, family history, social history, health maintenance, notes from last encounter, lab results, imaging with the patient/caregiver today.   Review of Systems  Constitutional: Negative.   HENT: Negative.    Eyes: Negative.   Respiratory: Negative.    Cardiovascular: Negative.   Gastrointestinal: Negative.   Endocrine: Negative.   Genitourinary: Negative.   Musculoskeletal: Negative.   Skin: Negative.   Allergic/Immunologic: Negative.   Neurological: Negative.   Hematological: Negative.   Psychiatric/Behavioral: Negative.    All other systems reviewed and are negative.      Objective:   Vitals:   07/25/22 1346 07/25/22 1409  BP: 114/68   Pulse: (!) 114 97  Resp: 16   Temp: 98.3 F (36.8 C)   TempSrc: Oral   SpO2: 97%   Weight: 193 lb 9.6 oz (87.8 kg)    Height: 5' (1.524 m)     Body mass index is 37.81 kg/m.  Physical Exam Vitals and nursing note reviewed.  Constitutional:      General: She is not in acute distress.    Appearance: Normal appearance. She is well-developed. She is obese. She is not ill-appearing, toxic-appearing or diaphoretic.  HENT:     Head: Normocephalic and atraumatic.     Right Ear: External ear normal.     Left Ear: External ear normal.     Nose: Nose normal.     Mouth/Throat:     Mouth: Mucous membranes are moist.     Pharynx: Oropharynx is clear. No oropharyngeal exudate or posterior oropharyngeal erythema.  Eyes:     General:        Right eye: No discharge.        Left eye: No discharge.     Conjunctiva/sclera: Conjunctivae normal.  Neck:     Trachea: No tracheal deviation.  Cardiovascular:     Rate and Rhythm: Normal rate and regular rhythm.     Pulses: Normal pulses.     Heart sounds: Normal heart sounds.  Pulmonary:     Effort: Pulmonary effort is normal. No respiratory distress.     Breath sounds: Normal breath sounds. No stridor. No wheezing, rhonchi or rales.  Abdominal:     General: Bowel sounds are normal. There is no distension.     Palpations: Abdomen is soft. There is no mass.     Tenderness: There is abdominal tenderness in the right upper quadrant. There is guarding (involuntary guarding to RLQ). There is no right CVA tenderness, left CVA tenderness or rebound. Negative signs include Murphy's sign and Rovsing's sign.  Skin:    General: Skin is warm and dry.     Coloration: Skin is not jaundiced or pale.     Findings: No erythema, lesion or rash.  Neurological:     Mental Status: She is alert. Mental status is at baseline.     Motor: No abnormal muscle tone.     Coordination: Coordination normal.  Psychiatric:        Mood and Affect: Mood normal.        Behavior: Behavior normal.      Results for orders placed or performed in visit on 05/24/22  Ova and parasite examination    Specimen: Stool  Result Value Ref Range   MICRO NUMBER: [id number]    SPECIMEN QUALITY: Adequate    Source STOOL    STATUS: FINAL    CONCENTRATE RESULT: No ova or parasites seen    TRICHROME RESULT: No ova or parasites seen    COMMENT:      Routine Ova and Parasite exam may not detect some parasites that occasionally cause diarrheal illness. Cryptosporidium Antigen and/or Cyclospora and  Isospora Exam may be ordered to detect these parasites. One negative sample does not necessarily rule out  the presence of a parasitic infection.  For additional information, please refer to https://education.[facility].com/faq/FAQ203 (This link is being provided for informational/ educational purposes only.)   Clostridium difficile culture-fecal   Specimen: Stool  Result Value Ref Range   Result-CDIFCU see note   CBC with Differential/Platelet  Result Value Ref Range   WBC 5.4 3.8 - 10.8 Thousand/uL   RBC 5.09 3.80 - 5.10 Million/uL   Hemoglobin 14.9 11.7 - 15.5 g/dL   HCT 45.5 (H) 35.0 - 45.0 %   MCV 89.4 80.0 - 100.0 fL   MCH 29.3 27.0 - 33.0 pg   MCHC 32.7 32.0 - 36.0 g/dL   RDW 11.9 11.0 - 15.0 %   Platelets 184 140 - 400 Thousand/uL   MPV 12.3 7.5 - 12.5 fL   Neutro Abs 2,408 1,500 - 7,800 cells/uL   Lymphs Abs 2,241 850 - 3,900 cells/uL   Absolute Monocytes 518 200 - 950 cells/uL   Eosinophils Absolute 200 15 - 500 cells/uL   Basophils Absolute 32 0 - 200 cells/uL   Neutrophils Relative % 44.6 %   Total Lymphocyte 41.5 %   Monocytes Relative 9.6 %   Eosinophils Relative 3.7 %   Basophils Relative 0.6 %  COMPLETE METABOLIC PANEL WITH GFR  Result Value Ref Range   Glucose, Bld 163 (H) 65 - 99 mg/dL   BUN 14 7 - 25 mg/dL   Creat 0.60 0.60 - 1.00 mg/dL   eGFR 97 > OR = 60 mL/min/1.6m   BUN/Creatinine Ratio SEE NOTE: 6 - 22 (calc)   Sodium 140 135 - 146 mmol/L   Potassium 4.5 3.5 - 5.3 mmol/L   Chloride 106 98 - 110 mmol/L   CO2 26 20 - 32 mmol/L   Calcium 9.2 8.6 - 10.4 mg/dL    Total Protein 6.9 6.1 - 8.1 g/dL   Albumin 4.2 3.6 - 5.1 g/dL   Globulin 2.7 1.9 - 3.7 g/dL (calc)   AG Ratio 1.6 1.0 - 2.5 (calc)   Total Bilirubin 0.3 0.2 - 1.2 mg/dL   Alkaline phosphatase (APISO) 91 37 - 153 U/L   AST 20 10 - 35 U/L   ALT 28 6 - 29 U/L  Lipid panel  Result Value Ref Range   Cholesterol 160 <200 mg/dL   HDL 42 (L) > OR = 50 mg/dL   Triglycerides 72 <150 mg/dL   LDL Cholesterol (Calc) 102 (H) mg/dL (calc)   Total CHOL/HDL Ratio 3.8 <5.0 (calc)   Non-HDL Cholesterol (Calc) 118 <130 mg/dL (calc)  Hemoglobin A1c  Result Value Ref Range   Hgb A1c MFr Bld 7.7 (H) <5.7 % of total Hgb   Mean Plasma Glucose 174 mg/dL   eAG (mmol/L) 9.7 mmol/L  Salmonella/Shigella Cult, [place] EIA and Shiga Toxin reflex  Result Value Ref Range   MICRO NUMBER [id number]   SPECIMEN QUALITY Adequate    SOURCE: STOOL    STATUS: FINAL    Result: Not Detected    MICRO NUMBER: [id number]   SPECIMEN QUALITY: Adequate    SOURCE: STOOL    STATUS: FINAL    SHIGA RESULT: Not Detected    MICRO NUMBER: [id number]   SPECIMEN QUALITY: Adequate    SOURCE: STOOL    STATUS: FINAL    SS RESULT: No Salmonella or Shigella isolated   Giardia and Cryptosporidium Antigen Panel  Result Value Ref Range  micro Number: [id number]    Specimen Quality: Adequate    Source STOOL    Status: FINAL    Cryptosporidium Antigen, EIA      Not Detected NOTE: Due to intermittent shedding, one negative sample does not necessarily rule out the presence of a parasitic infection.   MICRO NUMBER: [id number]    SPECIMEN QUALITY: Adequate    Source: STOOL    STATUS: FINAL    RESULT: Not Detected    COMMENT:      NOTE: Due to intermittent shedding, one negative sample does not necessarily rule out the presence of a parasitic infection.       Assessment & Plan:     ICD-10-CM   1. Right lower quadrant abdominal pain  R10.31 CT Abdomen Pelvis Wo Contrast    POCT urinalysis dipstick    CBC with Differential/Platelet     Comprehensive metabolic panel    Urine Culture    ondansetron (ZOFRAN) 4 MG tablet    CBC with Differential/Platelet    COMPLETE METABOLIC PANEL WITH GFR    2. Bowel habit changes  R19.4 CT Abdomen Pelvis Wo Contrast    3. Rebound tenderness  R10.829 CT Abdomen Pelvis Wo Contrast    CBC with Differential/Platelet    Comprehensive metabolic panel    CBC with Differential/Platelet    COMPLETE METABOLIC PANEL WITH GFR    4. Anorexia  R63.0 CT Abdomen Pelvis Wo Contrast    CBC with Differential/Platelet    Comprehensive metabolic panel    ondansetron (ZOFRAN) 4 MG tablet    pantoprazole (PROTONIX) 20 MG tablet    CBC with Differential/Platelet    COMPLETE METABOLIC PANEL WITH GFR     Pt with 3 days of gradually worsening lower abd pain with anorexia, some subtle stool changes and nausea.   Tenderness with guarding to RLQ - with age and sx stat CT scan to r/o appendicitis.  UA pending Labs may be done at hospital depending on CT results.  UA - neg leuk, neg nitrite Ketone positive which is consistent w/ anorexia Results for orders placed or performed in visit on 07/25/22  POCT urinalysis dipstick  Result Value Ref Range   Color, UA Yellow    Clarity, UA Clear    Glucose, UA Negative Negative   Bilirubin, UA Negative    Ketones, UA Positive    Spec Grav, UA 1.010 1.010 - 1.025   Blood, UA Negative    pH, UA 5.0 5.0 - 8.0   Protein, UA Negative Negative   Urobilinogen, UA 2.0 (A) 0.2 or 1.0 E.U./dL   Nitrite, UA Negative    Leukocytes, UA Negative Negative   Appearance Yellow    Odor Foul     Will see if we can get stat CT today Labs ordered for over at the hospital  If unable to get stat CT I explained that we can do labs and wait for results, however if pain localized to RLQ, becomes more severe (pain with movement or walking) she may need to present to ED, she verbalized understanding.      Delsa Grana, PA-C 07/25/22 2:21 PM

## 2022-07-25 NOTE — Addendum Note (Signed)
Addended by: Delsa Grana on: 07/25/2022 03:57 PM   Modules accepted: Orders

## 2022-07-26 ENCOUNTER — Telehealth: Payer: Self-pay

## 2022-07-26 ENCOUNTER — Other Ambulatory Visit: Payer: Self-pay

## 2022-07-26 ENCOUNTER — Inpatient Hospital Stay: Payer: Medicare HMO | Admitting: Anesthesiology

## 2022-07-26 ENCOUNTER — Observation Stay
Admission: EM | Admit: 2022-07-26 | Discharge: 2022-07-27 | Disposition: A | Discharge status: Home / Self Care | Payer: Medicare HMO | Attending: Surgery | Admitting: Surgery

## 2022-07-26 ENCOUNTER — Ambulatory Visit
Admission: RE | Admit: 2022-07-26 | Discharge: 2022-07-26 | Disposition: A | Discharge status: Home / Self Care | Payer: Medicare HMO | Source: Ambulatory Visit | Attending: Family Medicine | Admitting: Family Medicine

## 2022-07-26 ENCOUNTER — Encounter: Payer: Self-pay | Admitting: Emergency Medicine

## 2022-07-26 ENCOUNTER — Encounter
Admission: EM | Disposition: A | Discharge status: Home / Self Care | Payer: Medicare HMO | Source: Home / Self Care | Attending: Emergency Medicine

## 2022-07-26 DIAGNOSIS — K353 Acute appendicitis with localized peritonitis, without perforation or gangrene: Principal | ICD-10-CM

## 2022-07-26 DIAGNOSIS — R1031 Right lower quadrant pain: Secondary | ICD-10-CM | POA: Insufficient documentation

## 2022-07-26 DIAGNOSIS — K3589 Other acute appendicitis without perforation or gangrene: Secondary | ICD-10-CM | POA: Diagnosis not present

## 2022-07-26 DIAGNOSIS — K3580 Unspecified acute appendicitis: Secondary | ICD-10-CM | POA: Diagnosis not present

## 2022-07-26 DIAGNOSIS — E119 Type 2 diabetes mellitus without complications: Secondary | ICD-10-CM | POA: Insufficient documentation

## 2022-07-26 DIAGNOSIS — Z79899 Other long term (current) drug therapy: Secondary | ICD-10-CM | POA: Insufficient documentation

## 2022-07-26 DIAGNOSIS — R109 Unspecified abdominal pain: Secondary | ICD-10-CM | POA: Diagnosis not present

## 2022-07-26 DIAGNOSIS — G8929 Other chronic pain: Secondary | ICD-10-CM

## 2022-07-26 DIAGNOSIS — R630 Anorexia: Secondary | ICD-10-CM | POA: Insufficient documentation

## 2022-07-26 DIAGNOSIS — R10829 Rebound abdominal tenderness, unspecified site: Secondary | ICD-10-CM | POA: Insufficient documentation

## 2022-07-26 DIAGNOSIS — I10 Essential (primary) hypertension: Secondary | ICD-10-CM | POA: Insufficient documentation

## 2022-07-26 DIAGNOSIS — R194 Change in bowel habit: Secondary | ICD-10-CM | POA: Insufficient documentation

## 2022-07-26 HISTORY — PX: XI ROBOTIC LAPAROSCOPIC ASSISTED APPENDECTOMY: SHX6877

## 2022-07-26 LAB — CBC WITH DIFFERENTIAL/PLATELET
Absolute Monocytes: 1122 cells/uL — ABNORMAL HIGH (ref 200–950)
Basophils Absolute: 37 cells/uL (ref 0–200)
Basophils Relative: 0.3 %
Eosinophils Absolute: 110 cells/uL (ref 15–500)
Eosinophils Relative: 0.9 %
HCT: 44.4 % (ref 35.0–45.0)
Hemoglobin: 14.7 g/dL (ref 11.7–15.5)
Lymphs Abs: 3087 cells/uL (ref 850–3900)
MCH: 29.6 pg (ref 27.0–33.0)
MCHC: 33.1 g/dL (ref 32.0–36.0)
MCV: 89.5 fL (ref 80.0–100.0)
MPV: 12.2 fL (ref 7.5–12.5)
Monocytes Relative: 9.2 %
Neutro Abs: 7845 cells/uL — ABNORMAL HIGH (ref 1500–7800)
Neutrophils Relative %: 64.3 %
Platelets: 175 10*3/uL (ref 140–400)
RBC: 4.96 10*6/uL (ref 3.80–5.10)
RDW: 12.3 % (ref 11.0–15.0)
Total Lymphocyte: 25.3 %
WBC: 12.2 10*3/uL — ABNORMAL HIGH (ref 3.8–10.8)

## 2022-07-26 LAB — URINE CULTURE
MICRO NUMBER:: 14688315
SPECIMEN QUALITY:: ADEQUATE

## 2022-07-26 LAB — COMPLETE METABOLIC PANEL WITH GFR
AG Ratio: 1.2 (calc) (ref 1.0–2.5)
ALT: 14 U/L (ref 6–29)
AST: 12 U/L (ref 10–35)
Albumin: 4 g/dL (ref 3.6–5.1)
Alkaline phosphatase (APISO): 79 U/L (ref 37–153)
BUN: 21 mg/dL (ref 7–25)
CO2: 23 mmol/L (ref 20–32)
Calcium: 9.4 mg/dL (ref 8.6–10.4)
Chloride: 101 mmol/L (ref 98–110)
Creat: 0.88 mg/dL (ref 0.60–1.00)
Globulin: 3.3 g/dL (calc) (ref 1.9–3.7)
Glucose, Bld: 108 mg/dL — ABNORMAL HIGH (ref 65–99)
Potassium: 4.4 mmol/L (ref 3.5–5.3)
Sodium: 138 mmol/L (ref 135–146)
Total Bilirubin: 0.4 mg/dL (ref 0.2–1.2)
Total Protein: 7.3 g/dL (ref 6.1–8.1)
eGFR: 71 mL/min/{1.73_m2} (ref >=60)

## 2022-07-26 LAB — CBC
HCT: 44.9 % (ref 36.0–46.0)
Hemoglobin: 14.1 g/dL (ref 12.0–15.0)
MCH: 28.8 pg (ref 26.0–34.0)
MCHC: 31.4 g/dL (ref 30.0–36.0)
MCV: 91.6 fL (ref 80.0–100.0)
Platelets: 181 10*3/uL (ref 150–400)
RBC: 4.9 MIL/uL (ref 3.87–5.11)
RDW: 13.2 % (ref 11.5–15.5)
WBC: 8.5 10*3/uL (ref 4.0–10.5)
nRBC: 0 % (ref 0.0–0.2)

## 2022-07-26 LAB — COMPREHENSIVE METABOLIC PANEL
ALT: 19 U/L (ref 0–44)
AST: 19 U/L (ref 15–41)
Albumin: 4 g/dL (ref 3.5–5.0)
Alkaline Phosphatase: 76 U/L (ref 38–126)
Anion gap: 13 (ref 5–15)
BUN: 15 mg/dL (ref 8–23)
CO2: 23 mmol/L (ref 22–32)
Calcium: 9.3 mg/dL (ref 8.9–10.3)
Chloride: 101 mmol/L (ref 98–111)
Creatinine, Ser: 0.54 mg/dL (ref 0.44–1.00)
GFR, Estimated: >60 mL/min (ref >60)
Glucose, Bld: 122 mg/dL — ABNORMAL HIGH (ref 70–99)
Potassium: 3.7 mmol/L (ref 3.5–5.1)
Sodium: 137 mmol/L (ref 135–145)
Total Bilirubin: 0.9 mg/dL (ref 0.3–1.2)
Total Protein: 7.8 g/dL (ref 6.5–8.1)

## 2022-07-26 LAB — CBG MONITORING, ED
Glucose-Capillary: 99 mg/dL (ref 70–99)
Glucose-Capillary: 161 mg/dL — ABNORMAL HIGH (ref 70–99)

## 2022-07-26 LAB — LIPASE, BLOOD: Lipase: 42 U/L (ref 11–51)

## 2022-07-26 LAB — GLUCOSE, CAPILLARY: Glucose-Capillary: 170 mg/dL — ABNORMAL HIGH (ref 70–99)

## 2022-07-26 SURGERY — APPENDECTOMY, ROBOT-ASSISTED, LAPAROSCOPIC
Anesthesia: General

## 2022-07-26 MED ORDER — POLYETHYLENE GLYCOL 3350 17 G PO PACK: 17.0000 g | PACK | Freq: Every day | ORAL | Status: DC | PRN

## 2022-07-26 MED ORDER — DEXMEDETOMIDINE HCL IN NACL 200 MCG/50ML IV SOLN
INTRAVENOUS | Status: DC | PRN
  Administered 2022-07-26: 12 ug via INTRAVENOUS

## 2022-07-26 MED ORDER — EPINEPHRINE PF 1 MG/ML IJ SOLN
INTRAMUSCULAR | Status: DC | PRN
  Administered 2022-07-26: 40 mL

## 2022-07-26 MED ORDER — PROPOFOL 10 MG/ML IV BOLUS
INTRAVENOUS | Status: DC | PRN
  Administered 2022-07-26: 150 mg via INTRAVENOUS
  Administered 2022-07-26: 50 mg via INTRAVENOUS

## 2022-07-26 MED ORDER — INSULIN ASPART 100 UNIT/ML IJ SOLN
0.0000 [IU] | Freq: Three times a day (TID) | INTRAMUSCULAR | Status: DC
  Filled 2022-07-26: qty 1

## 2022-07-26 MED ORDER — OXYCODONE HCL 5 MG PO TABS: 5.0000 mg | ORAL_TABLET | ORAL | Status: DC | PRN

## 2022-07-26 MED ORDER — CHLORHEXIDINE GLUCONATE 0.12 % MT SOLN: 15.0000 mL | Freq: Once | OROMUCOSAL | Status: AC

## 2022-07-26 MED ORDER — LACTATED RINGERS IV SOLN: INTRAVENOUS | Status: DC

## 2022-07-26 MED ORDER — LABETALOL HCL 5 MG/ML IV SOLN
INTRAVENOUS | Status: DC | PRN
  Administered 2022-07-26 (×2): 2.5 mg via INTRAVENOUS

## 2022-07-26 MED ORDER — SODIUM CHLORIDE 0.9 % IR SOLN
Status: DC | PRN
  Administered 2022-07-26: 400 mL

## 2022-07-26 MED ORDER — ACETAMINOPHEN 10 MG/ML IV SOLN
INTRAVENOUS | Status: DC | PRN
  Administered 2022-07-26: 1000 mg via INTRAVENOUS

## 2022-07-26 MED ORDER — ORAL CARE MOUTH RINSE: 15.0000 mL | Freq: Once | OROMUCOSAL | Status: AC

## 2022-07-26 MED ORDER — PHENYLEPHRINE 80 MCG/ML (10ML) SYRINGE FOR IV PUSH (FOR BLOOD PRESSURE SUPPORT)
PREFILLED_SYRINGE | INTRAVENOUS | Status: AC
  Filled 2022-07-26: qty 10

## 2022-07-26 MED ORDER — KETOROLAC TROMETHAMINE 30 MG/ML IJ SOLN
15.0000 mg | Freq: Four times a day (QID) | INTRAMUSCULAR | Status: DC
  Administered 2022-07-26 – 2022-07-27 (×2): 15 mg via INTRAVENOUS
  Filled 2022-07-26 (×2): qty 1

## 2022-07-26 MED ORDER — FENTANYL CITRATE (PF) 100 MCG/2ML IJ SOLN
INTRAMUSCULAR | Status: AC
  Filled 2022-07-26: qty 2

## 2022-07-26 MED ORDER — HYDROMORPHONE HCL 1 MG/ML IJ SOLN
0.5000 mg | INTRAMUSCULAR | Status: DC | PRN
  Administered 2022-07-27: 0.5 mg via INTRAVENOUS
  Filled 2022-07-26: qty 0.5

## 2022-07-26 MED ORDER — ONDANSETRON HCL 4 MG/2ML IJ SOLN: 4.0000 mg | Freq: Four times a day (QID) | INTRAMUSCULAR | Status: DC | PRN

## 2022-07-26 MED ORDER — FENTANYL CITRATE (PF) 100 MCG/2ML IJ SOLN
25.0000 ug | INTRAMUSCULAR | Status: DC | PRN
  Administered 2022-07-26: 50 ug via INTRAVENOUS

## 2022-07-26 MED ORDER — EPINEPHRINE PF 1 MG/ML IJ SOLN
INTRAMUSCULAR | Status: AC
  Filled 2022-07-26: qty 1

## 2022-07-26 MED ORDER — ONDANSETRON HCL 4 MG/2ML IJ SOLN
INTRAMUSCULAR | Status: DC | PRN
  Administered 2022-07-26: 4 mg via INTRAVENOUS

## 2022-07-26 MED ORDER — LIDOCAINE HCL (PF) 2 % IJ SOLN
INTRAMUSCULAR | Status: AC
  Filled 2022-07-26: qty 5

## 2022-07-26 MED ORDER — BUPIVACAINE LIPOSOME 1.3 % IJ SUSP
INTRAMUSCULAR | Status: AC
  Filled 2022-07-26: qty 10

## 2022-07-26 MED ORDER — ACETAMINOPHEN 10 MG/ML IV SOLN: 1000.0000 mg | Freq: Once | INTRAVENOUS | Status: DC | PRN

## 2022-07-26 MED ORDER — ROCURONIUM BROMIDE 10 MG/ML (PF) SYRINGE
PREFILLED_SYRINGE | INTRAVENOUS | Status: AC
  Filled 2022-07-26: qty 10

## 2022-07-26 MED ORDER — FENTANYL CITRATE (PF) 100 MCG/2ML IJ SOLN
INTRAMUSCULAR | Status: AC
  Administered 2022-07-26: 50 ug via INTRAVENOUS
  Filled 2022-07-26: qty 2

## 2022-07-26 MED ORDER — PANTOPRAZOLE SODIUM 40 MG IV SOLR
40.0000 mg | Freq: Every day | INTRAVENOUS | Status: DC
  Administered 2022-07-26: 40 mg via INTRAVENOUS
  Filled 2022-07-26: qty 10

## 2022-07-26 MED ORDER — SUGAMMADEX SODIUM 200 MG/2ML IV SOLN
INTRAVENOUS | Status: DC | PRN
  Administered 2022-07-26: 180 mg via INTRAVENOUS

## 2022-07-26 MED ORDER — ACETAMINOPHEN 10 MG/ML IV SOLN
INTRAVENOUS | Status: AC
  Filled 2022-07-26: qty 100

## 2022-07-26 MED ORDER — FENTANYL CITRATE (PF) 100 MCG/2ML IJ SOLN
INTRAMUSCULAR | Status: DC | PRN
  Administered 2022-07-26 (×2): 50 ug via INTRAVENOUS

## 2022-07-26 MED ORDER — PROPOFOL 10 MG/ML IV BOLUS
INTRAVENOUS | Status: AC
  Filled 2022-07-26: qty 20

## 2022-07-26 MED ORDER — LABETALOL HCL 5 MG/ML IV SOLN
INTRAVENOUS | Status: AC
  Filled 2022-07-26: qty 4

## 2022-07-26 MED ORDER — OXYCODONE HCL 5 MG/5ML PO SOLN: 5.0000 mg | Freq: Once | ORAL | Status: AC | PRN

## 2022-07-26 MED ORDER — DEXAMETHASONE SODIUM PHOSPHATE 10 MG/ML IJ SOLN
INTRAMUSCULAR | Status: DC | PRN
  Administered 2022-07-26: 10 mg via INTRAVENOUS

## 2022-07-26 MED ORDER — INSULIN ASPART 100 UNIT/ML IJ SOLN: 0.0000 [IU] | Freq: Every day | INTRAMUSCULAR | Status: DC

## 2022-07-26 MED ORDER — 0.9 % SODIUM CHLORIDE (POUR BTL) OPTIME: TOPICAL | Status: DC | PRN

## 2022-07-26 MED ORDER — BUPIVACAINE HCL (PF) 0.5 % IJ SOLN
INTRAMUSCULAR | Status: AC
  Filled 2022-07-26: qty 30

## 2022-07-26 MED ORDER — ONDANSETRON HCL 4 MG/2ML IJ SOLN
INTRAMUSCULAR | Status: AC
  Filled 2022-07-26: qty 2

## 2022-07-26 MED ORDER — SODIUM CHLORIDE 0.9 % IV SOLN: INTRAVENOUS | Status: DC

## 2022-07-26 MED ORDER — BUPIVACAINE LIPOSOME 1.3 % IJ SUSP
INTRAMUSCULAR | Status: AC
  Filled 2022-07-26: qty 20

## 2022-07-26 MED ORDER — DEXAMETHASONE SODIUM PHOSPHATE 10 MG/ML IJ SOLN
INTRAMUSCULAR | Status: AC
  Filled 2022-07-26: qty 1

## 2022-07-26 MED ORDER — ONDANSETRON HCL 4 MG/2ML IJ SOLN: 4.0000 mg | Freq: Once | INTRAMUSCULAR | Status: DC | PRN

## 2022-07-26 MED ORDER — ROCURONIUM BROMIDE 100 MG/10ML IV SOLN
INTRAVENOUS | Status: DC | PRN
  Administered 2022-07-26: 20 mg via INTRAVENOUS
  Administered 2022-07-26: 40 mg via INTRAVENOUS

## 2022-07-26 MED ORDER — SUCCINYLCHOLINE CHLORIDE 200 MG/10ML IV SOSY
PREFILLED_SYRINGE | INTRAVENOUS | Status: AC
  Filled 2022-07-26: qty 10

## 2022-07-26 MED ORDER — PIPERACILLIN-TAZOBACTAM 3.375 G IVPB 30 MIN
3.3750 g | Freq: Once | INTRAVENOUS | Status: AC
  Administered 2022-07-26: 3.375 g via INTRAVENOUS
  Filled 2022-07-26: qty 50

## 2022-07-26 MED ORDER — ACETAMINOPHEN 500 MG PO TABS
1000.0000 mg | ORAL_TABLET | Freq: Four times a day (QID) | ORAL | Status: DC
  Administered 2022-07-27: 1000 mg via ORAL
  Filled 2022-07-26 (×2): qty 2

## 2022-07-26 MED ORDER — LIDOCAINE HCL (CARDIAC) PF 100 MG/5ML IV SOSY
PREFILLED_SYRINGE | INTRAVENOUS | Status: DC | PRN
  Administered 2022-07-26: 50 mg via INTRAVENOUS

## 2022-07-26 MED ORDER — ESMOLOL HCL 100 MG/10ML IV SOLN
INTRAVENOUS | Status: AC
  Filled 2022-07-26: qty 10

## 2022-07-26 MED ORDER — ONDANSETRON 4 MG PO TBDP: 4.0000 mg | ORAL_TABLET | Freq: Four times a day (QID) | ORAL | Status: DC | PRN

## 2022-07-26 MED ORDER — OXYCODONE HCL 5 MG PO TABS
ORAL_TABLET | ORAL | Status: AC
  Administered 2022-07-26: 5 mg via ORAL
  Filled 2022-07-26: qty 1

## 2022-07-26 MED ORDER — SUCCINYLCHOLINE CHLORIDE 200 MG/10ML IV SOSY
PREFILLED_SYRINGE | INTRAVENOUS | Status: DC | PRN
  Administered 2022-07-26: 100 mg via INTRAVENOUS

## 2022-07-26 MED ORDER — IOHEXOL 300 MG/ML  SOLN: 100.0000 mL | Freq: Once | INTRAMUSCULAR | Status: DC | PRN

## 2022-07-26 MED ORDER — ESMOLOL HCL-SODIUM CHLORIDE 2000 MG/100ML IV SOLN
INTRAVENOUS | Status: DC | PRN
  Administered 2022-07-26: 30 mg via INTRAVENOUS
  Administered 2022-07-26: 10 mg via INTRAVENOUS

## 2022-07-26 MED ORDER — PHENYLEPHRINE 80 MCG/ML (10ML) SYRINGE FOR IV PUSH (FOR BLOOD PRESSURE SUPPORT)
PREFILLED_SYRINGE | INTRAVENOUS | Status: DC | PRN
  Administered 2022-07-26: 160 ug via INTRAVENOUS
  Administered 2022-07-26: 80 ug via INTRAVENOUS

## 2022-07-26 MED ORDER — CHLORHEXIDINE GLUCONATE 0.12 % MT SOLN
OROMUCOSAL | Status: AC
  Administered 2022-07-26: 15 mL via OROMUCOSAL
  Filled 2022-07-26: qty 15

## 2022-07-26 MED ORDER — PIPERACILLIN-TAZOBACTAM 3.375 G IVPB
3.3750 g | Freq: Three times a day (TID) | INTRAVENOUS | Status: DC
  Administered 2022-07-26 – 2022-07-27 (×2): 3.375 g via INTRAVENOUS
  Filled 2022-07-26 (×2): qty 50

## 2022-07-26 MED ORDER — OXYCODONE HCL 5 MG PO TABS: 5.0000 mg | ORAL_TABLET | Freq: Once | ORAL | Status: AC | PRN

## 2022-07-26 SURGICAL SUPPLY — 66 items
ADH SKN CLS APL DERMABOND .7 (GAUZE/BANDAGES/DRESSINGS) ×1
ADH SKN CLS LQ APL DERMABOND (GAUZE/BANDAGES/DRESSINGS) ×1
BAG PRESSURE INF REUSE 1000 (BAG) IMPLANT
BLADE SURG SZ11 CARB STEEL (BLADE) ×2 IMPLANT
CANNULA REDUC XI 12-8 STAPL (CANNULA) ×1
CANNULA REDUCER 12-8 DVNC XI (CANNULA) ×2 IMPLANT
COVER TIP SHEARS 8 DVNC (MISCELLANEOUS) IMPLANT
COVER TIP SHEARS 8MM DA VINCI (MISCELLANEOUS) ×1
COVER WAND RF STERILE (DRAPES) ×2 IMPLANT
CUTTER FLEX LINEAR 45M (STAPLE) IMPLANT
DERMABOND ADVANCED .7 DNX12 (GAUZE/BANDAGES/DRESSINGS) ×2 IMPLANT
DERMABOND ADVANCED .7 DNX6 (GAUZE/BANDAGES/DRESSINGS) IMPLANT
DRAPE ARM DVNC X/XI (DISPOSABLE) ×8 IMPLANT
DRAPE COLUMN DVNC XI (DISPOSABLE) ×2 IMPLANT
DRAPE DA VINCI XI ARM (DISPOSABLE) ×4
DRAPE DA VINCI XI COLUMN (DISPOSABLE) ×1
ELECT REM PT RETURN 9FT ADLT (ELECTROSURGICAL) ×1
ELECTRODE REM PT RTRN 9FT ADLT (ELECTROSURGICAL) ×2 IMPLANT
GLOVE SURG SYN 7.0 (GLOVE) ×2 IMPLANT
GLOVE SURG SYN 7.0 PF PI (GLOVE) ×4 IMPLANT
GLOVE SURG SYN 7.5  E (GLOVE) ×2
GLOVE SURG SYN 7.5 E (GLOVE) ×2 IMPLANT
GLOVE SURG SYN 7.5 PF PI (GLOVE) ×4 IMPLANT
GOWN STRL REUS W/ TWL LRG LVL3 (GOWN DISPOSABLE) ×6 IMPLANT
GOWN STRL REUS W/TWL LRG LVL3 (GOWN DISPOSABLE) ×3
GRASPER SUT TROCAR 14GX15 (MISCELLANEOUS) IMPLANT
IRRIGATION STRYKERFLOW (MISCELLANEOUS) IMPLANT
IRRIGATOR STRYKERFLOW (MISCELLANEOUS) ×1
IV NS 1000ML (IV SOLUTION)
IV NS 1000ML BAXH (IV SOLUTION) IMPLANT
KIT PINK PAD W/HEAD ARE REST (MISCELLANEOUS) ×1 IMPLANT
KIT PINK PAD W/HEAD ARM REST (MISCELLANEOUS) ×2 IMPLANT
LABEL OR SOLS (LABEL) IMPLANT
MANIFOLD NEPTUNE II (INSTRUMENTS) ×2 IMPLANT
NDL INSUFFLATION 14GA 120MM (NEEDLE) ×2 IMPLANT
NEEDLE HYPO 22GX1.5 SAFETY (NEEDLE) ×2 IMPLANT
NEEDLE INSUFFLATION 14GA 120MM (NEEDLE) ×1 IMPLANT
OBTURATOR OPTICAL STANDARD 8MM (TROCAR) ×1
OBTURATOR OPTICAL STND 8 DVNC (TROCAR) ×1
OBTURATOR OPTICALSTD 8 DVNC (TROCAR) ×2 IMPLANT
PACK LAP CHOLECYSTECTOMY (MISCELLANEOUS) ×2 IMPLANT
RELOAD STAPLE 45 3.5 BLU DVNC (STAPLE) IMPLANT
RELOAD STAPLER 3.5X45 BLU DVNC (STAPLE) ×1 IMPLANT
SEAL CANN UNIV 5-8 DVNC XI (MISCELLANEOUS) ×6 IMPLANT
SEAL XI 5MM-8MM UNIVERSAL (MISCELLANEOUS) ×3
SEALER VESSEL DA VINCI XI (MISCELLANEOUS) ×1
SEALER VESSEL EXT DVNC XI (MISCELLANEOUS) IMPLANT
SET TUBE SMOKE EVAC HIGH FLOW (TUBING) ×2 IMPLANT
SOL ELECTROSURG ANTI STICK (MISCELLANEOUS) ×1
SOLUTION ELECTROSURG ANTI STCK (MISCELLANEOUS) ×2 IMPLANT
STAPLER 45 DA VINCI SURE FORM (STAPLE) ×1
STAPLER 45 SUREFORM DVNC (STAPLE) IMPLANT
STAPLER CANNULA SEAL DVNC XI (STAPLE) ×2 IMPLANT
STAPLER CANNULA SEAL XI (STAPLE) ×1
STAPLER RELOAD 3.5X45 BLU DVNC (STAPLE) ×1
STAPLER RELOAD 3.5X45 BLUE (STAPLE) ×1
SUT MNCRL AB 4-0 PS2 18 (SUTURE) ×2 IMPLANT
SUT VIC AB 3-0 SH 27 (SUTURE) ×1
SUT VIC AB 3-0 SH 27X BRD (SUTURE) ×2 IMPLANT
SUT VICRYL 0 AB UR-6 (SUTURE) ×2 IMPLANT
SUT VICRYL 0 UR6 27IN ABS (SUTURE) ×2 IMPLANT
SYS BAG RETRIEVAL 10MM (BASKET) ×1
SYSTEM BAG RETRIEVAL 10MM (BASKET) ×2 IMPLANT
TRAP FLUID SMOKE EVACUATOR (MISCELLANEOUS) ×2 IMPLANT
TRAY FOLEY MTR SLVR 16FR STAT (SET/KITS/TRAYS/PACK) IMPLANT
WATER STERILE IRR 500ML POUR (IV SOLUTION) ×2 IMPLANT

## 2022-07-26 NOTE — Telephone Encounter (Signed)
Elizabeth Blackwell with [facility] calling to report abnormal results from CT Abdomen on pt. They had kept pt at facility until speaking with nurse to see what pt needed to do. Called and spoke with Maricela, CMA and transferred call successfully.

## 2022-07-26 NOTE — Anesthesia Postprocedure Evaluation (Signed)
Anesthesia Post Note  Patient: Elizabeth Blackwell  Procedure(s) Performed: XI ROBOTIC LAPAROSCOPIC ASSISTED APPENDECTOMY  Patient location during evaluation: PACU Anesthesia Type: General Level of consciousness: awake and alert, oriented and patient cooperative Pain management: pain level controlled Vital Signs Assessment: post-procedure vital signs reviewed and stable Respiratory status: spontaneous breathing, nonlabored ventilation and respiratory function stable Cardiovascular status: blood pressure returned to baseline and stable Postop Assessment: adequate PO intake Anesthetic complications: no   No notable events documented.   Last Vitals:  Vitals:   07/26/22 2052 07/26/22 2100  BP:  109/60  Pulse: 81 82  Resp: (!) 21 17  Temp:  36.4 C  SpO2: 95% 93%    Last Pain:  Vitals:   07/26/22 2100  TempSrc: (P) Oral  PainSc: [place]

## 2022-07-26 NOTE — ED Triage Notes (Signed)
Patient sent to ED from PCP after CT confirmed appendicitis. Patient states no pain at this time but pain started Sunday.

## 2022-07-26 NOTE — H&P (Signed)
Date of Admission:  07/26/2022  Reason for Admission:  Acute appendicitis  History of Present Illness: Elizabeth Blackwell is a 72 y.o. female with a 5 day history of right lower quadrant abdominal pain.  The patient reports that her pain started on Sunday 3/10.  She reports that over the week her pain has worsened.  Reports the pain is localized in the right lower quadrant.  Denies any fevers but reports chills.  Also reports nausea but no vomiting.  Has not had much of an appetite the last few days.  She presented to her PCP yesterday and had outpatient labs and CT scan today.  Her WBC yesterday was elevated to 12.2.  Her CT showed acute appendicitis with very distended appendix and periappendiceal stranding.  No evidence of perforation.  Today, the patient reports that after the CT scan her pain has improved.  Labs in the ED were repeated and show a normal WBC of 8.5.  No fevers, but was a bit tachycardic in ED.  No prior abdominal surgeries.  Past Medical History: Past Medical History:  Diagnosis Date   Diabetes mellitus without complication ([place])    Hypertension      Past Surgical History: Past Surgical History:  Procedure Laterality Date   BACK SURGERY     COLONOSCOPY WITH PROPOFOL     COLONOSCOPY WITH PROPOFOL N/A 12/11/2021   Procedure: COLONOSCOPY WITH PROPOFOL;  Surgeon: Elizabeth Landsman, MD;  Location: [facility] ENDOSCOPY;  Service: Gastroenterology;  Laterality: N/A;    Home Medications: Prior to Admission medications   Medication Sig Start Date End Date Taking? Authorizing Provider  ibuprofen (ADVIL) 800 MG tablet Take 1 tablet (800 mg total) by mouth 2 (two) times daily as needed for moderate pain. 10/16/21   Bo Merino, FNP  lisinopril (ZESTRIL) 40 MG tablet Take 1 tablet (40 mg total) by mouth daily. 05/24/22   Bo Merino, FNP  melatonin 5 MG TABS Take 10 mg by mouth at bedtime.    [provider]  neomycin-polymyxin b-dexamethasone (MAXITROL) 3.5-10000-0.1  SUSP Place 1 drop into the right eye 2 (two) times daily. 01/01/22   [provider]  ondansetron (ZOFRAN) 4 MG tablet Take 1 tablet (4 mg total) by mouth every 8 (eight) hours as needed for nausea or vomiting. 07/25/22   Delsa Grana, PA-C  pantoprazole (PROTONIX) 20 MG tablet Take 1 tablet (20 mg total) by mouth every morning. One hour before breakfast 07/25/22   Delsa Grana, PA-C  terconazole (TERAZOL 7) 0.4 % vaginal cream Place 1 applicator vaginally at bedtime. 10/16/21   Bo Merino, FNP  TRADJENTA 5 MG TABS tablet TAKE 1 TABLET EVERY DAY 04/03/22   Bo Merino, FNP    Allergies: Allergies  Allergen Reactions   Codeine Phosphate     REACTION: unspecified    Social History:  reports that she has never smoked. She has never used smokeless tobacco. She reports current alcohol use of about 1.0 standard drink of alcohol per week. She reports that she does not use drugs.   Family History: History reviewed. No pertinent family history.  Review of Systems: Review of Systems  Constitutional:  Positive for chills. Negative for fever.  HENT:  Negative for hearing loss.   Respiratory:  Negative for shortness of breath.   Cardiovascular:  Negative for chest pain.  Gastrointestinal:  Positive for abdominal pain and nausea. Negative for constipation, diarrhea and vomiting.  Genitourinary:  Negative for dysuria.  Musculoskeletal:  Negative for  myalgias.  Skin:  Negative for rash.  Neurological:  Negative for dizziness.  Psychiatric/Behavioral:  Negative for depression.     Physical Exam BP (!) 135/58 (BP Location: Left Arm)   Pulse (!) 102   Temp 97.8 F (36.6 C) (Oral)   Resp 18   SpO2 96%  CONSTITUTIONAL: No acute distress HEENT:  Normocephalic, atraumatic, extraocular motion intact. NECK: Trachea is midline, and there is no jugular venous distension.  RESPIRATORY:  Normal respiratory effort without pathologic use of accessory muscles. CARDIOVASCULAR:  Regular  rhythm and rate GI: The abdomen is soft, non-distended, with localized tenderness in the right lower quadrant.  No diffuse peritonitis.  MUSCULOSKELETAL:  Normal muscle strength and tone in all four extremities.  No peripheral edema or cyanosis. SKIN: Skin turgor is normal. There are no pathologic skin lesions.  NEUROLOGIC:  Motor and sensation is grossly normal.  Cranial nerves are grossly intact. PSYCH:  Alert and oriented to person, place and time. Affect is normal.  Laboratory Analysis: Results for orders placed or performed during the hospital encounter of 07/26/22 (from the past 24 hour(s))  Lipase, blood     Status: None   Collection Time: 07/26/22  2:53 PM  Result Value Ref Range   Lipase 42 11 - 51 U/L  Comprehensive metabolic panel     Status: Abnormal   Collection Time: 07/26/22  2:53 PM  Result Value Ref Range   Sodium 137 135 - 145 mmol/L   Potassium 3.7 3.5 - 5.1 mmol/L   Chloride 101 98 - 111 mmol/L   CO2 23 22 - 32 mmol/L   Glucose, Bld 122 (H) 70 - 99 mg/dL   BUN 15 8 - 23 mg/dL   Creatinine, Ser 0.54 0.44 - 1.00 mg/dL   Calcium 9.3 8.9 - 10.3 mg/dL   Total Protein 7.8 6.5 - 8.1 g/dL   Albumin 4.0 3.5 - 5.0 g/dL   AST 19 15 - 41 U/L   ALT 19 0 - 44 U/L   Alkaline Phosphatase 76 38 - 126 U/L   Total Bilirubin 0.9 0.3 - 1.2 mg/dL   GFR, Estimated >60 >60 mL/min   Anion gap 13 5 - 15  CBC     Status: None   Collection Time: 07/26/22  2:53 PM  Result Value Ref Range   WBC 8.5 4.0 - 10.5 K/uL   RBC 4.90 3.87 - 5.11 MIL/uL   Hemoglobin 14.1 12.0 - 15.0 g/dL   HCT 44.9 36.0 - 46.0 %   MCV 91.6 80.0 - 100.0 fL   MCH 28.8 26.0 - 34.0 pg   MCHC 31.4 30.0 - 36.0 g/dL   RDW 13.2 11.5 - 15.5 %   Platelets 181 150 - 400 K/uL   nRBC 0.0 0.0 - 0.2 %    Imaging: CT Abdomen Pelvis Wo Contrast  Result Date: 07/26/2022 CLINICAL DATA:  Pain right lower quadrant of abdomen EXAM: CT ABDOMEN AND PELVIS WITHOUT CONTRAST TECHNIQUE: Multidetector CT imaging of the abdomen and  pelvis was performed following the standard protocol without IV contrast. RADIATION DOSE REDUCTION: This exam was performed according to the departmental dose-optimization program which includes automated exposure control, adjustment of the mA and/or kV according to patient size and/or use of iterative reconstruction technique. COMPARISON:  None Available. FINDINGS: Lower chest: Small linear densities in the medial lower lung fields may suggest scarring. Dense calcification is seen in mitral annulus. Hepatobiliary: No focal abnormalities are seen in liver. There is no dilation of bile ducts.  Gallbladder is not distended. Pancreas: No focal abnormalities are seen. Spleen: Unremarkable. Adrenals/Urinary Tract: Adrenals are unremarkable. There is no hydronephrosis. There are no renal or ureteral stones. Urinary bladder is not distended. Stomach/Bowel: Stomach is unremarkable. Small bowel loops are not dilated. Appendix is dilated measuring up to 1.3 cm in diameter. There is stranding in the pericecal fat. There is no demonstrable loculated pericecal fluid collection. There is no wall thickening in colon. Scattered diverticula are seen in colon without signs of focal diverticulitis. Vascular/Lymphatic: Scattered calcifications are seen in aorta and its major branches. There are subcentimeter nodes in the pericecal region. Reproductive: Unremarkable. Other: There is no ascites or pneumoperitoneum. Musculoskeletal: Degenerative changes are noted in lumbar spine and lower thoracic spine with disc space narrowing, bony spurs, spinal stenosis and encroachment of neural foramina at multiple levels. IMPRESSION: There is abnormal dilation of appendix along with marked pericecal inflammatory stranding. Findings suggest severe acute appendicitis. Surgical consultation should be considered. There is no demonstrable pericecal abscess. There is no ascites or pneumoperitoneum. There is no hydronephrosis. Other findings as described  in the body of the report. These results will be called to the ordering clinician or representative by the Radiologist Assistant, and communication documented in the PACS or [employer]. Electronically Signed   By: Elmer Picker M.D.   On: 07/26/2022 10:32    Assessment and Plan: This is a 72 y.o. female with acute appendicitis  --Discussed with the patient the findings in the CT scan and her labs.  Overall her appendix is quite distended with significant appendiceal stranding which extends towards the cecum.  The wall of the cecum is not thickened on CT.  No evidence of perforation, but discussed with the patient that if her pain is better today, could be a reflection of an early perforation and the release of pressure from the appendix itself.  Discussed that would not recommend trying to manage this with antibiotics alone and would recommend surgical intervention.  She is in agreement. --Discussed with her the plan for a robotic assisted appendectomy.  Reviewed the surgery at length with her including the planned incisions, the risks of bleeding, infection, injury to surrounding structures, the possibility of a drain, the possibility of needing a more extensive resection in the form of ileocecectomy, hospital stay, pain control, and she's willing to proceed. --Will take her to OR today pending anesthesia/OR team availability.  All of her questions have been answered.  I spent 75 minutes dedicated to the care of this patient on the date of this encounter to include pre-visit review of records, face-to-face time with the patient discussing diagnosis and management, and any post-visit coordination of care.   Melvyn Neth, MD [facility] Pg:  [phone]

## 2022-07-26 NOTE — Op Note (Signed)
  Procedure Date:  07/26/2022  Pre-operative Diagnosis:  Acute appendicitis  Post-operative Diagnosis: Acute appendicitis  Procedure:  Robotic assisted appendectomy  Surgeon:  Melvyn Neth, MD  Anesthesia:  General endotracheal  Estimated Blood Loss:  20 ml  Specimens:  appendix  Complications:  None  Indications for Procedure:  This is a 72 y.o. female who presents with abdominal pain and workup revealing acute appendicitis.  The options of surgery versus observation were reviewed with the patient and/or family. The risks of bleeding, infection, recurrence of symptoms, negative laparoscopy, potential for an open procedure, bowel injury, abscess or infection, were all discussed with the patient and she was willing to proceed.  Description of Procedure: The patient was correctly identified in the preoperative area and brought into the operating room.  The patient was placed supine with VTE prophylaxis in place.  Appropriate time-outs were performed.  Anesthesia was induced and the patient was intubated.  Appropriate antibiotics were infused.  Foley catheter was inserted.  The abdomen was prepped and draped in a sterile fashion. A Veress needle was introduced in the left upper quadrant and pneumoperitoneum was obtained with appropriate pressures.  Using Optiview technique, an 8 mm port was introduced in the left lateral abdominal wall without complications.  Then, a 12 mm port was introduced in the left upper quadrant and an 8 mm port in the left lower quadrant under direct visualization.  The DaVinci platform was docked, camera targeted, and instruments placed under direct visualization.  The right lower quadrant was inspected and the appendix was identified and found to be acutely inflamed, with adhesions forming between the appendix to the anterior abdominal wall and the terminal ileum to the appendix.  These were taken down bluntly.  The appendix was carefully dissected.  The  mesoappendix was divided using the Vessel sealer.  The base of the appendix was dissected out and divided with the robotic blue load 45 mm stapler x 2 loads, including a portion of the cecum to make sure we had a non-inflamed margin.  The appendix was placed in an Endocatch bag.  The right lower quadrant was then inspected again revealing an intact staple line, no bleeding, and no bowel injury.  The area was thoroughly irrigated.  The DaVinci platform was then undocked and instruments removed.    40 ml of Exparel solution mixed with 0.5% bupivacaine with epi was infiltrated around the port sites.  The 12 mm port was removed and bag retrieved.  The fascia was closed under direct visualization utilizing an Endo Close technique with 0 Vicryl suture.  The 8 mm ports were removed. The 12 mm incision was closed using 3-0 Vicryl and 4-0 Monocryl, and the other port incisions were closed with 4-0 Monocryl.  The wounds were cleaned and sealed with DermaBond.  Foley catheter was removed and the patient was emerged from anesthesia and extubated and brought to the recovery room for further management.  The patient tolerated the procedure well and all counts were correct at the end of the case.   Melvyn Neth, MD

## 2022-07-26 NOTE — Transfer of Care (Signed)
Immediate Anesthesia Transfer of Care Note  Patient: Elizabeth Blackwell  Procedure(s) Performed: XI ROBOTIC LAPAROSCOPIC ASSISTED APPENDECTOMY  Patient Location: PACU  Anesthesia Type:General  Level of Consciousness: sedated  Airway & Oxygen Therapy: Patient Spontanous Breathing and Patient connected to face mask oxygen  Post-op Assessment: Report given to RN and Post -op Vital signs reviewed and stable  Post vital signs: Reviewed  Last Vitals:  Vitals Value Taken Time  BP 107/64 07/26/22 1951  Temp    Pulse 87 07/26/22 1953  Resp 31 07/26/22 1953  SpO2 100 % 07/26/22 1953  Vitals shown include unvalidated device data.  Last Pain:  Vitals:   07/26/22 1734  TempSrc: Oral  PainSc: 0-No pain         Complications: No notable events documented.

## 2022-07-26 NOTE — ED Provider Notes (Signed)
[facility] Provider Note    Event Date/Time   First MD Initiated Contact with Patient 07/26/22 1540     (approximate)  History   Chief Complaint: Abdominal Pain  HPI  Elizabeth Blackwell is a 72 y.o. female the past medical history of hypertension, diabetes, presents to the emergency department for abdominal pain and a positive CT acute appendicitis.  According to the patient's for the past 5 days she has been experiencing pain mostly in the right lower quadrant.  Denies any vomiting or diarrhea denies any fever.  States it has been a moderate aching type pain initially more intermittent but now more constant.  Saw her doctor who sent her for an outpatient CT which was performed today showing acute appendicitis without perforation.  I have reviewed the CT read and findings.  Patient denies any fever at any point.  States her pain is moderate currently.  Physical Exam   Triage Vital Signs: ED Triage Vitals  Enc Vitals Group     BP 07/26/22 1448 (!) 135/58     Pulse Rate 07/26/22 1448 (!) 102     Resp 07/26/22 1448 18     Temp 07/26/22 1448 97.8 F (36.6 C)     Temp Source 07/26/22 1448 Oral     SpO2 07/26/22 1448 96 %     Weight --      Height --      Head Circumference --      Peak Flow --      Pain Score 07/26/22 1451 0     Pain Loc --      Pain Edu? --      Excl. in [place]? --     Most recent vital signs: Vitals:   07/26/22 1448  BP: (!) 135/58  Pulse: (!) 102  Resp: 18  Temp: 97.8 F (36.6 C)  SpO2: 96%    General: Awake, no distress.  CV:  Good peripheral perfusion.  Regular rate and rhythm  Resp:  Normal effort.  Equal breath sounds bilaterally.  Abd:  No distention.  Soft, mild to moderate right lower quadrant tenderness.  No rebound or guarding.    ED Results / Procedures / Treatments   RADIOLOGY  Outpatient CT shows appendicitis without perforation.  MEDICATIONS ORDERED IN ED: Medications - No data to display   IMPRESSION  / MDM / [place] / ED COURSE  I reviewed the triage vital signs and the nursing notes.  Patient's presentation is most consistent with acute presentation with potential threat to life or bodily function.  Patient presents emergency department for right lower quadrant pain for the past 4 to 5 days, outpatient CT confirms acute appendicitis.  Patient's lab work performed today shows a normal CBC with a normal white blood cell count, reassuring chemistry with a negative lipase.  Overall patient appears well, no distress.  Afebrile reassuring vital signs besides mild tachycardia we will dose 500 cc normal saline.  I spoke to Dr. Hampton Abbot of general surgery who has been down to see the patient and will be adding her onto the OR schedule today.  He has requested that we start IV Zosyn which I have ordered for the patient.  Patient to be admitted to the surgical service.  FINAL CLINICAL IMPRESSION(S) / ED DIAGNOSES   Acute appendicitis    Note:  This document was prepared using Dragon voice recognition software and may include unintentional dictation errors.   Harvest Dark, MD 07/26/22 [phone]

## 2022-07-26 NOTE — Progress Notes (Signed)
I reviewed CT results, called pt and discussed with her, she is to present to the ER for acute appendicitis  Last ate and drank last night at 6 pm Only drank contast per CT today and she understands to remain NPO She has about the same amount of pain ER will be called to alert she is on the way

## 2022-07-26 NOTE — Anesthesia Procedure Notes (Signed)
Procedure Name: Intubation Date/Time: 07/26/2022 6:12 PM  Performed by: Levin Erp, CRNAPre-anesthesia Checklist: Patient identified, Emergency Drugs available, Suction available, Patient being monitored and Timeout performed Patient Re-evaluated:Patient Re-evaluated prior to induction Oxygen Delivery Method: Circle system utilized Preoxygenation: Pre-oxygenation with 100% oxygen Induction Type: IV induction and Rapid sequence Laryngoscope Size: McGraph and 3 Grade View: Grade I Tube type: Oral Tube size: 6.5 mm Number of attempts: 1 Airway Equipment and Method: Stylet Placement Confirmation: ETT inserted through vocal cords under direct vision, positive ETCO2 and breath sounds checked- equal and bilateral Secured at: 20 (at teeth) cm Tube secured with: Tape Dental Injury: Teeth and Oropharynx as per pre-operative assessment

## 2022-07-26 NOTE — Telephone Encounter (Signed)
Elizabeth Blackwell from [facility] main hospital registration called due to pt being sent to main hospital from ER registration. PT was sent to ER per [facility] note and abnormal CT for acute appendicitis. Elizabeth Blackwell was advised to send pt back to the ER .

## 2022-07-26 NOTE — Anesthesia Preprocedure Evaluation (Addendum)
Anesthesia Evaluation  Patient identified by MRN, date of birth, ID band Patient awake    Reviewed: Allergy & Precautions, NPO status , Patient's Chart, lab work & pertinent test results  History of Anesthesia Complications Negative for: history of anesthetic complications  Airway Mallampati: II   Neck ROM: Full    Dental  (+) Missing   Pulmonary neg pulmonary ROS   Pulmonary exam normal breath sounds clear to auscultation       Cardiovascular hypertension, Normal cardiovascular exam Rhythm:Regular Rate:Normal     Neuro/Psych Chronic hip and back pain    GI/Hepatic negative GI ROS, Neg liver ROS,,,  Endo/Other  diabetes, Type 2  Obesity   Renal/GU negative Renal ROS     Musculoskeletal   Abdominal   Peds  Hematology negative hematology ROS (+)   Anesthesia Other Findings   Reproductive/Obstetrics                             Anesthesia Physical Anesthesia Plan  ASA: 2 and emergent  Anesthesia Plan: General   Post-op Pain Management:    Induction: Intravenous and Rapid sequence  PONV Risk Score and Plan: 3 and Ondansetron, Dexamethasone and Treatment may vary due to age or medical condition  Airway Management Planned: Oral ETT  Additional Equipment:   Intra-op Plan:   Post-operative Plan: Extubation in OR  Informed Consent: I have reviewed the patients History and Physical, chart, labs and discussed the procedure including the risks, benefits and alternatives for the proposed anesthesia with the patient or authorized representative who has indicated his/her understanding and acceptance.     Dental advisory given  Plan Discussed with: CRNA  Anesthesia Plan Comments: (Patient consented for risks of anesthesia including but not limited to:  - adverse reactions to medications - damage to eyes, teeth, lips or other oral mucosa - nerve damage due to positioning  - sore  throat or hoarseness - damage to heart, brain, nerves, lungs, other parts of body or loss of life  Informed patient about role of CRNA in peri- and intra-operative care.  Patient voiced understanding.)       Anesthesia Quick Evaluation

## 2022-07-27 ENCOUNTER — Encounter: Payer: Self-pay | Admitting: Surgery

## 2022-07-27 DIAGNOSIS — K3580 Unspecified acute appendicitis: Secondary | ICD-10-CM | POA: Diagnosis not present

## 2022-07-27 LAB — CBC
HCT: 42.8 % (ref 36.0–46.0)
Hemoglobin: 13.4 g/dL (ref 12.0–15.0)
MCH: 28.7 pg (ref 26.0–34.0)
MCHC: 31.3 g/dL (ref 30.0–36.0)
MCV: 91.6 fL (ref 80.0–100.0)
Platelets: 178 10*3/uL (ref 150–400)
RBC: 4.67 MIL/uL (ref 3.87–5.11)
RDW: 13.2 % (ref 11.5–15.5)
WBC: 5.8 10*3/uL (ref 4.0–10.5)
nRBC: 0 % (ref 0.0–0.2)

## 2022-07-27 LAB — BASIC METABOLIC PANEL
Anion gap: 11 (ref 5–15)
BUN: 14 mg/dL (ref 8–23)
CO2: 23 mmol/L (ref 22–32)
Calcium: 8.5 mg/dL — ABNORMAL LOW (ref 8.9–10.3)
Chloride: 103 mmol/L (ref 98–111)
Creatinine, Ser: 0.68 mg/dL (ref 0.44–1.00)
GFR, Estimated: >60 mL/min (ref >60)
Glucose, Bld: 191 mg/dL — ABNORMAL HIGH (ref 70–99)
Potassium: 4.1 mmol/L (ref 3.5–5.1)
Sodium: 137 mmol/L (ref 135–145)

## 2022-07-27 MED ORDER — OXYCODONE HCL 5 MG PO TABS: 5.0000 mg | ORAL_TABLET | Freq: Four times a day (QID) | ORAL | 0 refills | Status: DC | PRN

## 2022-07-27 NOTE — Care Management CC44 (Signed)
Condition Code 44 Documentation Completed  Patient Details  Name: ELLIVIA POMMIER MRN: [id number] Date of Birth: 05/23/1950   Condition Code 44 given:  Yes Patient signature on Condition Code 44 notice:  Yes Documentation of 2 MD's agreement:  Yes Code 44 added to claim:  Yes    Candie Chroman, LCSW 07/27/2022, 10:03 AM

## 2022-07-27 NOTE — Discharge Instructions (Signed)
In addition to included general post-operative instructions,  Diet: Resume home diet.   Activity: No heavy lifting >20 pounds (children, pets, laundry, garbage) or strenuous activity for 4 weeks, but light activity and walking are encouraged. Do not drive or drink alcohol if taking narcotic pain medications or having pain that might distract from driving.  Wound care: 2 days after surgery (03/16), you may shower/get incision wet with soapy water and pat dry (do not rub incisions), but no baths or submerging incision underwater until follow-up.   Medications: Resume all home medications. For mild to moderate pain: acetaminophen (Tylenol) or ibuprofen/naproxen (if no kidney disease). Combining Tylenol with alcohol can substantially increase your risk of causing liver disease. Narcotic pain medications, if prescribed, can be used for severe pain, though may cause nausea, constipation, and drowsiness. Do not combine Tylenol and Percocet (or similar) within a 6 hour period as Percocet (and similar) contain(s) Tylenol. If you do not need the narcotic pain medication, you do not need to fill the prescription.  Call office [phone] / [phone]) at any time if any questions, worsening pain, fevers/chills, bleeding, drainage from incision site, or other concerns.

## 2022-07-27 NOTE — TOC CM/SW Note (Signed)
Patient has orders to discharge home today. Chart reviewed. No TOC needs identified. Her daughter will pick her up today. CSW signing off.  Dayton Scrape, [place]

## 2022-07-27 NOTE — Discharge Summary (Signed)
[facility] SURGICAL DISCHARGE SUMMARY  Patient ID: Elizabeth Blackwell MRN: [id number] DOB/AGE: 1950-10-27 72 y.o.  Admit date: 07/26/2022 Discharge date: 07/27/2022  Discharge Diagnoses Patient Active Problem List   Diagnosis Date Noted   Acute appendicitis 07/26/2022    Consultants None  Procedures 07/15/2021:  Laparoscopic Appendectomy   HPI: Elizabeth Blackwell is a 72 y.o. female with a 5 day history of right lower quadrant abdominal pain.  The patient reports that her pain started on Sunday 3/10.  She reports that over the week her pain has worsened.  Reports the pain is localized in the right lower quadrant.  Denies any fevers but reports chills.  Also reports nausea but no vomiting.  Has not had much of an appetite the last few days.  She presented to her PCP yesterday and had outpatient labs and CT scan today.  Her WBC yesterday was elevated to 12.2.  Her CT showed acute appendicitis with very distended appendix and periappendiceal stranding.  No evidence of perforation.  Today, the patient reports that after the CT scan her pain has improved.  Labs in the ED were repeated and show a normal WBC of 8.5.  No fevers, but was a bit tachycardic in ED.  No prior abdominal surgeries.   Hospital Course: Informed consent was obtained and documented, and patient underwent uneventful laparoscopic appendectomy (Dr Hampton Abbot, 07/26/2022).  Post-operatively, patient's pain/symptoms improved/resolved and advancement of patient's diet and ambulation were well-tolerated. The remainder of patient's hospital course was essentially unremarkable, and discharge planning was initiated accordingly with patient safely able to be discharged home with appropriate discharge instructions, pain control, and outpatient follow-up after all of her questions were answered to her expressed satisfaction.   Discharge Condition: Good    Physical Examination:  Constitutional: Well appearing female,  NAD Pulmonary: Normal effort; no respiratory distress Gastrointestinal: Soft, non-tender, non-distended, no rebound/guarding Skin: Laparoscopic incisions are CDI with dermabond, no erythema or drainage    Allergies as of 07/27/2022       Reactions   Codeine Phosphate    REACTION: unspecified        Medication List     TAKE these medications    ibuprofen 800 MG tablet Commonly known as: ADVIL Take 1 tablet (800 mg total) by mouth 2 (two) times daily as needed for moderate pain.   lisinopril 40 MG tablet Commonly known as: ZESTRIL Take 1 tablet (40 mg total) by mouth daily.   melatonin 5 MG Tabs Take 10 mg by mouth at bedtime.   neomycin-polymyxin b-dexamethasone 3.5-10000-0.1 Susp Commonly known as: MAXITROL Place 1 drop into the right eye 2 (two) times daily.   ondansetron 4 MG tablet Commonly known as: Zofran Take 1 tablet (4 mg total) by mouth every 8 (eight) hours as needed for nausea or vomiting.   oxyCODONE 5 MG immediate release tablet Commonly known as: Oxy IR/ROXICODONE Take 1 tablet (5 mg total) by mouth every 6 (six) hours as needed for severe pain or breakthrough pain.   pantoprazole 20 MG tablet Commonly known as: Protonix Take 1 tablet (20 mg total) by mouth every morning. One hour before breakfast   terconazole 0.4 % vaginal cream Commonly known as: TERAZOL 7 Place 1 applicator vaginally at bedtime.   Tradjenta 5 MG Tabs tablet Generic drug: linagliptin TAKE 1 TABLET EVERY DAY          Follow-up Information     Tylene Fantasia, PA-C. Schedule an appointment as soon as possible for a  visit in 3 week(s).   Specialty: Physician Assistant Why: s/p lap appy Contact information: [address] [place] [address] [phone]                  Time spent on discharge management including discussion of hospital course, clinical condition, outpatient instructions, prescriptions, and follow up with the patient and  members of the medical team: >30 minutes  -- Edison Simon , PA-C [facility]  07/27/2022, 9:05 AM [phone] M-F: 7am - 4pm

## 2022-07-27 NOTE — Care Management Obs Status (Signed)
MEDICARE OBSERVATION STATUS NOTIFICATION   Patient Details  Name: Elizabeth Blackwell MRN: [id number] Date of Birth: 09-May-1951   Medicare Observation Status Notification Given:  Yes    Candie Chroman, LCSW 07/27/2022, 10:02 AM

## 2022-07-30 LAB — SURGICAL PATHOLOGY

## 2022-07-30 LAB — GLUCOSE, CAPILLARY: Glucose-Capillary: 183 mg/dL — ABNORMAL HIGH (ref 70–99)

## 2022-08-03 ENCOUNTER — Ambulatory Visit (INDEPENDENT_AMBULATORY_CARE_PROVIDER_SITE_OTHER): Payer: Medicare HMO

## 2022-08-03 VITALS — Ht 60.0 in | Wt 193.0 lb

## 2022-08-03 DIAGNOSIS — Z0000 Encounter for general adult medical examination without abnormal findings: Secondary | ICD-10-CM

## 2022-08-03 NOTE — Progress Notes (Signed)
I connected with  Elizabeth Blackwell on 08/03/22 by a audio enabled telemedicine application and verified that I am speaking with the correct person using two identifiers.  Patient Location: Home  Provider Location: Office/Clinic  I discussed the limitations of evaluation and management by telemedicine. The patient expressed understanding and agreed to proceed.  Subjective:   Elizabeth Blackwell is a 72 y.o. female who presents for Medicare Annual (Subsequent) preventive examination.  Review of Systems    Cardiac Risk Factors include: dyslipidemia;hypertension;obesity (BMI >30kg/m2);sedentary lifestyle;advanced age (>69men, >40 women)    Objective:    Today's Vitals   08/03/22 1258  Weight: 193 lb (87.5 kg)  Height: 5' (1.524 m)   Body mass index is 37.69 kg/m.     08/03/2022    1:12 PM 07/26/2022    5:35 PM 07/26/2022    2:51 PM 12/11/2021    8:03 AM 08/01/2021    1:51 PM  Advanced Directives  Does Patient Have a Medical Advance Directive? Yes Yes Yes No No  Type of Advance Directive  Living will Living will    Does patient want to make changes to medical advance directive?  No - Guardian declined     Would patient like information on creating a medical advance directive?     No - Patient declined    Current Medications (verified) Outpatient Encounter Medications as of 08/03/2022  Medication Sig   ibuprofen (ADVIL) 800 MG tablet Take 1 tablet (800 mg total) by mouth 2 (two) times daily as needed for moderate pain.   lisinopril (ZESTRIL) 40 MG tablet Take 1 tablet (40 mg total) by mouth daily.   melatonin 5 MG TABS Take 10 mg by mouth at bedtime.   pantoprazole (PROTONIX) 20 MG tablet Take 1 tablet (20 mg total) by mouth every morning. One hour before breakfast   TRADJENTA 5 MG TABS tablet TAKE 1 TABLET EVERY DAY   neomycin-polymyxin b-dexamethasone (MAXITROL) 3.5-10000-0.1 SUSP Place 1 drop into the right eye 2 (two) times daily. (Patient not taking: Reported on 07/26/2022)    ondansetron (ZOFRAN) 4 MG tablet Take 1 tablet (4 mg total) by mouth every 8 (eight) hours as needed for nausea or vomiting. (Patient not taking: Reported on 07/26/2022)   oxyCODONE (OXY IR/ROXICODONE) 5 MG immediate release tablet Take 1 tablet (5 mg total) by mouth every 6 (six) hours as needed for severe pain or breakthrough pain. (Patient not taking: Reported on 08/03/2022)   terconazole (TERAZOL 7) 0.4 % vaginal cream Place 1 applicator vaginally at bedtime. (Patient not taking: Reported on 07/26/2022)   No facility-administered encounter medications on file as of 08/03/2022.    Allergies (verified) Codeine phosphate   History: Past Medical History:  Diagnosis Date   Diabetes mellitus without complication ([place])    Hypertension    Past Surgical History:  Procedure Laterality Date   BACK SURGERY     COLONOSCOPY WITH PROPOFOL     COLONOSCOPY WITH PROPOFOL N/A 12/11/2021   Procedure: COLONOSCOPY WITH PROPOFOL;  Surgeon: Lin Landsman, MD;  Location: [facility] ENDOSCOPY;  Service: Gastroenterology;  Laterality: N/A;   XI ROBOTIC LAPAROSCOPIC ASSISTED APPENDECTOMY N/A 07/26/2022   Procedure: XI ROBOTIC LAPAROSCOPIC ASSISTED APPENDECTOMY;  Surgeon: Olean Ree, MD;  Location: [facility] ORS;  Service: General;  Laterality: N/A;   No family history on file. Social History   Socioeconomic History   Marital status: Divorced    Spouse name: Not on file   Number of children: 2   Years of education: Not  on file   Highest education level: Not on file  Occupational History   Not on file  Tobacco Use   Smoking status: Never   Smokeless tobacco: Never  Vaping Use   Vaping Use: Never used  Substance and Sexual Activity   Alcohol use: Yes    Alcohol/week: 1.0 standard drink of alcohol    Types: 1 Cans of beer per week   Drug use: Never   Sexual activity: Not Currently  Other Topics Concern   Not on file  Social History Narrative   Pt lives alone   Social Determinants of Health    Financial Resource Strain: Low Risk  (08/03/2022)   Overall Financial Resource Strain (CARDIA)    Difficulty of Paying Living Expenses: Not hard at all  Food Insecurity: No Food Insecurity (08/03/2022)   Hunger Vital Sign    Worried About Running Out of Food in the Last Year: Never true    Ran Out of Food in the Last Year: Never true  Transportation Needs: No Transportation Needs (08/03/2022)   PRAPARE - Hydrologist (Medical): No    Lack of Transportation (Non-Medical): No  Physical Activity: Inactive (08/03/2022)   Exercise Vital Sign    Days of Exercise per Week: 0 days    Minutes of Exercise per Session: 0 min  Stress: No Stress Concern Present (08/03/2022)   [employer]    Feeling of Stress : Not at all  Social Connections: Unknown (08/03/2022)   Social Connection and Isolation Panel [NHANES]    Frequency of Communication with Friends and Family: More than three times a week    Frequency of Social Gatherings with Friends and Family: More than three times a week    Attends Religious Services: Patient unable to answer    Active Member of Clubs or Organizations: No    Attends Archivist Meetings: Never    Marital Status: Divorced    Tobacco Counseling Counseling given: Not Answered   Clinical Intake:  Pre-visit preparation completed: Yes  Pain : No/denies pain     BMI - recorded: 37.69 Nutritional Status: BMI > 30  Obese Nutritional Risks: None Diabetes: Yes CBG done?: No (113 this morning) Did pt. bring in CBG monitor from home?: No  How often do you need to have someone help you when you read instructions, pamphlets, or other written materials from your doctor or pharmacy?: 1 - Never  Diabetic?yes Interpreter Needed?: No Comments: lives alone Information entered by :: B.Juliocesar Blasius,LPN   Activities of Daily Living    08/03/2022    1:13 PM 07/26/2022   11:00 PM   In your present state of health, do you have any difficulty performing the following activities:  Hearing? 0 0  Vision? 0 0  Difficulty concentrating or making decisions? 0 0  Walking or climbing stairs? 0 0  Dressing or bathing? 0 0  Doing errands, shopping? 0 0  Preparing Food and eating ? N   Using the Toilet? N   In the past six months, have you accidently leaked urine? N   Do you have problems with loss of bowel control? N   Managing your Medications? N   Managing your Finances? N   Housekeeping or managing your Housekeeping? N     Patient Care Team: Delsa Grana, PA-C as PCP - General (Family Medicine) Sharlotte Alamo, DPM (Podiatry) Dingeldein, Remo Lipps, MD (Ophthalmology)  Indicate any recent Medical Services you  may have received from other than [facility] providers in the past year (date may be approximate).     Assessment:   This is a routine wellness examination for [place].  Hearing/Vision screen Hearing Screening - Comments:: Adequate hearing Vision Screening - Comments:: Adequate vision w/glasses  [facility]  Dietary issues and exercise activities discussed: Current Exercise Habits: The patient does not participate in regular exercise at present, Exercise limited by: orthopedic condition(s)   Goals Addressed             This Visit's Progress    Patient Stated   On track    Pt would like to reduce sugar intake       Depression Screen    08/03/2022    1:09 PM 07/25/2022    1:46 PM 05/24/2022    9:42 AM 03/08/2022   12:54 PM 01/03/2022   11:39 AM 10/16/2021    8:17 AM 08/17/2021    9:34 AM  PHQ 2/9 Scores  PHQ - 2 Score 0 0 0 0 0 0 0  PHQ- 9 Score 0 0         Fall Risk    08/03/2022    1:05 PM 07/25/2022    1:45 PM 05/24/2022    9:42 AM 03/08/2022   12:54 PM 01/03/2022   11:39 AM  [place] in the past year? 0 0 0 0 0  Number falls in past yr: 0 0 0 0 0  Injury with Fall? 0 0 0 0 0  Risk for fall due to : No Fall Risks No Fall Risks      Follow up Education provided;Falls prevention discussed Falls prevention discussed;Education provided;Falls evaluation completed Falls evaluation completed Falls evaluation completed Falls evaluation completed    FALL RISK PREVENTION PERTAINING TO THE HOME:  Any stairs in or around the home? Yes  If so, are there any without handrails? Yes  Home free of loose throw rugs in walkways, pet beds, electrical cords, etc? Yes  Adequate lighting in your home to reduce risk of falls? Yes   ASSISTIVE DEVICES UTILIZED TO PREVENT FALLS:  Life alert? No  Use of a cane, walker or w/c? No  Grab bars in the bathroom? Yes  Shower chair or bench in shower? No  Elevated toilet seat or a handicapped toilet? No    Cognitive Function:        08/03/2022    1:17 PM  6CIT Screen  What Year? 0 points  What month? 0 points  What time? 0 points  Count back from 20 0 points  Months in reverse 0 points  Repeat phrase 0 points  Total Score 0 points    Immunizations Immunization History  Administered Date(s) Administered   Influenza-Unspecified 03/08/2021   PFIZER(Purple Top)SARS-COV-2 Vaccination 07/31/2019, 08/25/2019, 06/18/2020    TDAP status: Up to date  Flu Vaccine status: Declined, Education has been provided regarding the importance of this vaccine but patient still declined. Advised may receive this vaccine at local pharmacy or Health Dept. Aware to provide a copy of the vaccination record if obtained from local pharmacy or Health Dept. Verbalized acceptance and understanding.  Pneumococcal vaccine status: Declined,  Education has been provided regarding the importance of this vaccine but patient still declined. Advised may receive this vaccine at local pharmacy or Health Dept. Aware to provide a copy of the vaccination record if obtained from local pharmacy or Health Dept. Verbalized acceptance and understanding.   Covid-19 vaccine status: Completed vaccines  Qualifies for Shingles  Vaccine? Yes   Zostavax completed No   Shingrix Completed?: No.    Education has been provided regarding the importance of this vaccine. Patient has been advised to call insurance company to determine out of pocket expense if they have not yet received this vaccine. Advised may also receive vaccine at local pharmacy or Health Dept. Verbalized acceptance and understanding.  Screening Tests Health Maintenance  Topic Date Due   COVID-19 Vaccine (4 - 2023-24 season) 08/10/2022 (Originally 01/12/2022)   INFLUENZA VACCINE  08/12/2022 (Originally 12/12/2021)   Zoster Vaccines- Shingrix (1 of 2) 10/25/2022 (Originally 11/30/2001)   Pneumonia Vaccine 73+ Years old (1 of 1 - PCV) 03/09/2023 (Originally 11/30/2016)   Diabetic kidney evaluation - Urine ACR  10/17/2022   FOOT EXAM  10/17/2022   HEMOGLOBIN A1C  11/22/2022   MAMMOGRAM  12/06/2022   OPHTHALMOLOGY EXAM  01/02/2023   Diabetic kidney evaluation - eGFR measurement  07/27/2023   Medicare Annual Wellness (AWV)  08/03/2023   COLONOSCOPY (Pts 45-67yrs Insurance coverage will need to be confirmed)  12/12/2026   DEXA SCAN  Completed   Hepatitis C Screening  Completed   HPV VACCINES  Aged Out   DTaP/Tdap/Td  Discontinued    Health Maintenance  There are no preventive care reminders to display for this patient.   Colorectal cancer screening: Type of screening: Colonoscopy. Completed yes. Repeat every 10 years  Mammogram status: Completed yes. Repeat every yearordered for 12/06/22 or after  Bone Density status: Completed yes. Results reflect: Bone density results: NORMAL. Repeat every 5 years.  Lung Cancer Screening: (Low Dose CT Chest recommended if Age 75-80 years, 30 pack-year currently smoking OR have quit w/in 15years.) does not qualify.   Lung Cancer Screening Referral: no  Additional Screening:  Hepatitis C Screening: does not qualify; Completed yes  Vision Screening: Recommended annual ophthalmology exams for early detection of  glaucoma and other disorders of the eye. Is the patient up to date with their annual eye exam?  Yes  Who is the provider or what is the name of the office in which the patient attends annual eye exams? [facility] If pt is not established with a provider, would they like to be referred to a provider to establish care? No .   Dental Screening: Recommended annual dental exams for proper oral hygiene  Community Resource Referral / Chronic Care Management: CRR required this visit?  No   CCM required this visit?  No      Plan:     I have personally reviewed and noted the following in the patient's chart:   Medical and social history Use of alcohol, tobacco or illicit drugs  Current medications and supplements including opioid prescriptions. Patient is not currently taking opioid prescriptions. Functional ability and status Nutritional status Physical activity Advanced directives List of other physicians Hospitalizations, surgeries, and ER visits in previous 12 months Vitals Screenings to include cognitive, depression, and falls Referrals and appointments  In addition, I have reviewed and discussed with patient certain preventive protocols, quality metrics, and best practice recommendations. A written personalized care plan for preventive services as well as general preventive health recommendations were provided to patient.     Roger Shelter, LPN   [id number]   Nurse Notes: pt doing better since appendix surgery. She relays she is in no more pain and moving about. She has no questions or concerns this visit

## 2022-08-03 NOTE — Patient Instructions (Signed)
Elizabeth Blackwell , Thank you for taking time to come for your Medicare Wellness Visit. I appreciate your ongoing commitment to your health goals. Please review the following plan we discussed and let me know if I can assist you in the future.   These are the goals we discussed:  Goals      Patient Stated     Pt would like to reduce sugar intake        This is a list of the screening recommended for you and due dates:  Health Maintenance  Topic Date Due   COVID-19 Vaccine (4 - 2023-24 season) 08/10/2022*   Flu Shot  08/12/2022*   Zoster (Shingles) Vaccine (1 of 2) 10/25/2022*   Pneumonia Vaccine (1 of 1 - PCV) 03/09/2023*   Yearly kidney health urinalysis for diabetes  10/17/2022   Complete foot exam   10/17/2022   Hemoglobin A1C  11/22/2022   Mammogram  12/06/2022   Eye exam for diabetics  01/02/2023   Yearly kidney function blood test for diabetes  07/27/2023   Medicare Annual Wellness Visit  08/03/2023   Colon Cancer Screening  12/12/2026   DEXA scan (bone density measurement)  Completed   Hepatitis C Screening: USPSTF Recommendation to screen - Ages 29-79 yo.  Completed   HPV Vaccine  Aged Out   DTaP/Tdap/Td vaccine  Discontinued  *Topic was postponed. The date shown is not the original due date.    Advanced directives: yes  Conditions/risks identified: none  Next appointment: Follow up in one year for your annual wellness visit 08/09/2023 @1pm  telephone   Preventive Care 65 Years and Older, Female Preventive care refers to lifestyle choices and visits with your health care provider that can promote health and wellness. What does preventive care include? A yearly physical exam. This is also called an annual well check. Dental exams once or twice a year. Routine eye exams. Ask your health care provider how often you should have your eyes checked. Personal lifestyle choices, including: Daily care of your teeth and gums. Regular physical activity. Eating a healthy  diet. Avoiding tobacco and drug use. Limiting alcohol use. Practicing safe sex. Taking low-dose aspirin every day. Taking vitamin and mineral supplements as recommended by your health care provider. What happens during an annual well check? The services and screenings done by your health care provider during your annual well check will depend on your age, overall health, lifestyle risk factors, and family history of disease. Counseling  Your health care provider may ask you questions about your: Alcohol use. Tobacco use. Drug use. Emotional well-being. Home and relationship well-being. Sexual activity. Eating habits. History of falls. Memory and ability to understand (cognition). Work and work Statistician. Reproductive health. Screening  You may have the following tests or measurements: Height, weight, and BMI. Blood pressure. Lipid and cholesterol levels. These may be checked every 5 years, or more frequently if you are over 60 years old. Skin check. Lung cancer screening. You may have this screening every year starting at age 59 if you have a 30-pack-year history of smoking and currently smoke or have quit within the past 15 years. Fecal occult blood test (FOBT) of the stool. You may have this test every year starting at age 43. Flexible sigmoidoscopy or colonoscopy. You may have a sigmoidoscopy every 5 years or a colonoscopy every 10 years starting at age 60. Hepatitis C blood test. Hepatitis B blood test. Sexually transmitted disease (STD) testing. Diabetes screening. This is done by checking your  blood sugar (glucose) after you have not eaten for a while (fasting). You may have this done every 1-3 years. Bone density scan. This is done to screen for osteoporosis. You may have this done starting at age 49. Mammogram. This may be done every 1-2 years. Talk to your health care provider about how often you should have regular mammograms. Talk with your health care provider about  your test results, treatment options, and if necessary, the need for more tests. Vaccines  Your health care provider may recommend certain vaccines, such as: Influenza vaccine. This is recommended every year. Tetanus, diphtheria, and acellular pertussis (Tdap, Td) vaccine. You may need a Td booster every 10 years. Zoster vaccine. You may need this after age 27. Pneumococcal 13-valent conjugate (PCV13) vaccine. One dose is recommended after age 85. Pneumococcal polysaccharide (PPSV23) vaccine. One dose is recommended after age 66. Talk to your health care provider about which screenings and vaccines you need and how often you need them. This information is not intended to replace advice given to you by your health care provider. Make sure you discuss any questions you have with your health care provider. Document Released: 05/27/2015 Document Revised: 01/18/2016 Document Reviewed: 03/01/2015 Elsevier Interactive Patient Education  2017 [place] Prevention in the Home Falls can cause injuries. They can happen to people of all ages. There are many things you can do to make your home safe and to help prevent falls. What can I do on the outside of my home? Regularly fix the edges of walkways and driveways and fix any cracks. Remove anything that might make you trip as you walk through a door, such as a raised step or threshold. Trim any bushes or trees on the path to your home. Use bright outdoor lighting. Clear any walking paths of anything that might make someone trip, such as rocks or tools. Regularly check to see if handrails are loose or broken. Make sure that both sides of any steps have handrails. Any raised decks and porches should have guardrails on the edges. Have any leaves, snow, or ice cleared regularly. Use sand or salt on walking paths during winter. Clean up any spills in your garage right away. This includes oil or grease spills. What can I do in the bathroom? Use  night lights. Install grab bars by the toilet and in the tub and shower. Do not use towel bars as grab bars. Use non-skid mats or decals in the tub or shower. If you need to sit down in the shower, use a plastic, non-slip stool. Keep the floor dry. Clean up any water that spills on the floor as soon as it happens. Remove soap buildup in the tub or shower regularly. Attach bath mats securely with double-sided non-slip rug tape. Do not have throw rugs and other things on the floor that can make you trip. What can I do in the bedroom? Use night lights. Make sure that you have a light by your bed that is easy to reach. Do not use any sheets or blankets that are too big for your bed. They should not hang down onto the floor. Have a firm chair that has side arms. You can use this for support while you get dressed. Do not have throw rugs and other things on the floor that can make you trip. What can I do in the kitchen? Clean up any spills right away. Avoid walking on wet floors. Keep items that you use a lot in  easy-to-reach places. If you need to reach something above you, use a strong step stool that has a grab bar. Keep electrical cords out of the way. Do not use floor polish or wax that makes floors slippery. If you must use wax, use non-skid floor wax. Do not have throw rugs and other things on the floor that can make you trip. What can I do with my stairs? Do not leave any items on the stairs. Make sure that there are handrails on both sides of the stairs and use them. Fix handrails that are broken or loose. Make sure that handrails are as long as the stairways. Check any carpeting to make sure that it is firmly attached to the stairs. Fix any carpet that is loose or worn. Avoid having throw rugs at the top or bottom of the stairs. If you do have throw rugs, attach them to the floor with carpet tape. Make sure that you have a light switch at the top of the stairs and the bottom of the  stairs. If you do not have them, ask someone to add them for you. What else can I do to help prevent falls? Wear shoes that: Do not have high heels. Have rubber bottoms. Are comfortable and fit you well. Are closed at the toe. Do not wear sandals. If you use a stepladder: Make sure that it is fully opened. Do not climb a closed stepladder. Make sure that both sides of the stepladder are locked into place. Ask someone to hold it for you, if possible. Clearly mark and make sure that you can see: Any grab bars or handrails. First and last steps. Where the edge of each step is. Use tools that help you move around (mobility aids) if they are needed. These include: Canes. Walkers. Scooters. Crutches. Turn on the lights when you go into a dark area. Replace any light bulbs as soon as they burn out. Set up your furniture so you have a clear path. Avoid moving your furniture around. If any of your floors are uneven, fix them. If there are any pets around you, be aware of where they are. Review your medicines with your doctor. Some medicines can make you feel dizzy. This can increase your chance of falling. Ask your doctor what other things that you can do to help prevent falls. This information is not intended to replace advice given to you by your health care provider. Make sure you discuss any questions you have with your health care provider. Document Released: 02/24/2009 Document Revised: 10/06/2015 Document Reviewed: 06/04/2014 Elsevier Interactive Patient Education  2017 [employer].

## 2022-08-16 ENCOUNTER — Ambulatory Visit (INDEPENDENT_AMBULATORY_CARE_PROVIDER_SITE_OTHER): Payer: Medicare HMO | Admitting: Physician Assistant

## 2022-08-16 ENCOUNTER — Encounter: Payer: Self-pay | Admitting: Physician Assistant

## 2022-08-16 ENCOUNTER — Other Ambulatory Visit: Payer: Self-pay

## 2022-08-16 VITALS — BP 122/74 | HR 85 | Temp 97.6°F | Ht 60.0 in | Wt 198.4 lb

## 2022-08-16 DIAGNOSIS — K3580 Unspecified acute appendicitis: Secondary | ICD-10-CM

## 2022-08-16 DIAGNOSIS — Z09 Encounter for follow-up examination after completed treatment for conditions other than malignant neoplasm: Secondary | ICD-10-CM

## 2022-08-16 DIAGNOSIS — K353 Acute appendicitis with localized peritonitis, without perforation or gangrene: Secondary | ICD-10-CM

## 2022-08-16 NOTE — Patient Instructions (Signed)

## 2022-08-16 NOTE — Progress Notes (Signed)
[facility] POST-OP OFFICE VISIT  08/16/2022  HPI: Elizabeth Blackwell is a 72 y.o. female 21 days s/p robotic assisted laparoscopic appendectomy for acute appendicitis with Dr Hampton Abbot.   She has done well Notes "the first week was rough" but now pain free No fever, chills, nausea, emesis Bowels are regular Incisions well healed No other complaints   Vital signs: BP 122/74   Pulse 85   Temp 97.6 F (36.4 C) (Oral)   Ht 5' (1.524 m)   Wt 198 lb 6.4 oz (90 kg)   SpO2 96%   BMI 38.75 kg/m    Physical Exam: Constitutional: Well appearing female, NAD Abdomen: Soft, non-tender, non-distended, no rebound/guarding Skin: Laparoscopic incisions are healing well, no erythema or drainage   Assessment/Plan: This is a 72 y.o. female 21 days s/p robotic assisted laparoscopic appendectomy for acute appendicitis with Dr Hampton Abbot.    - Pain control prn  - Reviewed wound care recommendation  - Reviewed lifting restrictions; 4 weeks total  - Reviewed surgical pathology; Appendicitis  - She can follow up on as needed basis; She understands to call with questions/concerns  -- Edison Simon, PA-C [facility] 08/16/2022, 2:21 PM M-F: 7am - 4pm

## 2022-09-13 ENCOUNTER — Other Ambulatory Visit: Payer: Self-pay

## 2022-09-13 ENCOUNTER — Ambulatory Visit (INDEPENDENT_AMBULATORY_CARE_PROVIDER_SITE_OTHER): Payer: Medicare HMO | Admitting: Nurse Practitioner

## 2022-09-13 ENCOUNTER — Encounter: Payer: Self-pay | Admitting: Nurse Practitioner

## 2022-09-13 VITALS — BP 130/78 | HR 97 | Temp 98.2°F | Resp 16 | Ht 60.0 in | Wt 198.4 lb

## 2022-09-13 DIAGNOSIS — J029 Acute pharyngitis, unspecified: Secondary | ICD-10-CM

## 2022-09-13 LAB — POCT RAPID STREP A (OFFICE): Rapid Strep A Screen: NEGATIVE

## 2022-09-13 MED ORDER — AZITHROMYCIN 500 MG PO TABS: 500.0000 mg | ORAL_TABLET | Freq: Every day | ORAL | 0 refills | Status: AC

## 2022-09-13 NOTE — Progress Notes (Signed)
   BP 130/78   Pulse 97   Temp 98.2 F (36.8 C) (Oral)   Resp 16   Ht 5' (1.524 m)   Wt 198 lb 6.4 oz (90 kg)   SpO2 98%   BMI 38.75 kg/m    Subjective:    Patient ID: Elizabeth Blackwell, female    DOB: 26-Oct-1950, 72 y.o.   MRN: [id number]  HPI: Elizabeth Blackwell is a 72 y.o. female  Chief Complaint  Patient presents with   Sore Throat    For 1 week   Sore throat:patient reports she has had a sore throat for about a week. She denies any fever, or shortness of breath. Throat more sore on right then left.  She says she has been doing hot totties to help.  She says that it feels a little better today. She says she has also started having nasal congestion.  Rapid strep negative.  Exam showed tonsillary exudate, lymphadenopathy. Will treat empirically with azithromycin.  Can gargle with salt water, drink hot tea with honey, use throat lozenges.    Relevant past medical, surgical, family and social history reviewed and updated as indicated. Interim medical history since our last visit reviewed. Allergies and medications reviewed and updated.  Review of Systems  Constitutional: Negative for fever or weight change.  HEENT: sore throat Respiratory: Negative for cough and shortness of breath.   Cardiovascular: Negative for chest pain or palpitations.  Gastrointestinal: Negative for abdominal pain, no bowel changes.  Musculoskeletal: Negative for gait problem or joint swelling.  Skin: Negative for rash.  Neurological: Negative for dizziness or headache.  No other specific complaints in a complete review of systems (except as listed in HPI above).      Objective:    BP 130/78   Pulse 97   Temp 98.2 F (36.8 C) (Oral)   Resp 16   Ht 5' (1.524 m)   Wt 198 lb 6.4 oz (90 kg)   SpO2 98%   BMI 38.75 kg/m   Wt Readings from Last 3 Encounters:  09/13/22 198 lb 6.4 oz (90 kg)  08/16/22 198 lb 6.4 oz (90 kg)  08/03/22 193 lb (87.5 kg)    Physical Exam  Constitutional: Patient  appears well-developed and well-nourished. Obese  No distress.  HEENT: head atraumatic, normocephalic, pupils equal and reactive to light, ears TMs clear, neck supple, throat with redness and exudate, lymphadenopathy Cardiovascular: Normal rate, regular rhythm and normal heart sounds.  No murmur heard. No BLE edema. Pulmonary/Chest: Effort normal and breath sounds normal. No respiratory distress. Abdominal: Soft.  There is no tenderness. Psychiatric: Patient has a normal mood and affect. behavior is normal. Judgment and thought content normal.  Results for orders placed or performed in visit on 09/13/22  POCT rapid strep A  Result Value Ref Range   Rapid Strep A Screen Negative Negative      Assessment & Plan:   Problem List Items Addressed This Visit   None Visit Diagnoses     Sore throat    -  Primary   start azithromycin,  push fluids, tylenol for pain,  can gargle with salt water   Relevant Medications   azithromycin (ZITHROMAX) 500 MG tablet   Other Relevant Orders   POCT rapid strep A (Completed)        Follow up plan: Return if symptoms worsen or fail to improve.

## 2022-09-28 ENCOUNTER — Ambulatory Visit (INDEPENDENT_AMBULATORY_CARE_PROVIDER_SITE_OTHER): Payer: Medicare HMO | Admitting: Family Medicine

## 2022-09-28 ENCOUNTER — Encounter: Payer: Self-pay | Admitting: Family Medicine

## 2022-09-28 VITALS — BP 122/74 | HR 100 | Temp 97.9°F | Resp 16 | Ht 60.0 in | Wt 201.1 lb

## 2022-09-28 DIAGNOSIS — J069 Acute upper respiratory infection, unspecified: Secondary | ICD-10-CM

## 2022-09-28 NOTE — Patient Instructions (Signed)
Try claritin-D, saline nasal sprays, delsym/robitussin, and strongly recommend mucinex Follow up if there is any sudden severe worsening, but overall I expect the viral infection to be self-limiting and gradually improve over the next 1-2 weeks.    Viral Respiratory Infection A viral respiratory infection is an illness that affects parts of the body that are used for breathing. These include the lungs, nose, and throat. It is caused by a germ called a virus. Some examples of this kind of infection are: A cold. The flu (influenza). A respiratory syncytial virus (RSV) infection. What are the causes? This condition is caused by a virus. It spreads from person to person. You can get the virus if: You breathe in droplets from someone who is sick. You come in contact with people who are sick. You touch mucus or other fluid from a person who is sick. What are the signs or symptoms? Symptoms of this condition include: A stuffy or runny nose. A sore throat. A cough. Shortness of breath. Trouble breathing. Yellow or green fluid in the nose. Other symptoms may include: A fever. Sweating or chills. Tiredness (fatigue). Achy muscles. A headache. How is this treated? This condition may be treated with: Medicines that treat viruses. Medicines that make it easy to breathe. Medicines that are sprayed into the nose. Acetaminophen or NSAIDs, such as ibuprofen, to treat fever. Follow these instructions at home: Managing pain and congestion Take over-the-counter and prescription medicines only as told by your doctor. If you have a sore throat, gargle with salt water. Do this 3-4 times a day or as needed. To make salt water, dissolve -1 tsp (3-6 g) of salt in 1 cup (237 mL) of warm water. Make sure that all the salt dissolves. Use nose drops made from salt water. This helps with stuffiness (congestion). It also helps soften the skin around your nose. Take 2 tsp (10 mL) of honey at bedtime to  lessen coughing at night. Do not give honey to children who are younger than 53 year old. Drink enough fluid to keep your pee (urine) pale yellow. General instructions  Rest as much as possible. Do not drink alcohol. Do not smoke or use any products that contain nicotine or tobacco. If you need help quitting, ask your doctor. Keep all follow-up visits. How is this prevented?     Get a flu shot every year. Ask your doctor when you should get your flu shot. Do not let other people get your germs. If you are sick: Wash your hands with soap and water often. Wash your hands after you cough or sneeze. Wash hands for at least 20 seconds. If you cannot use soap and water, use hand sanitizer. Cover your mouth when you cough. Cover your nose and mouth when you sneeze. Do not share cups or eating utensils. Clean commonly used objects often. Clean commonly touched surfaces. Stay home from work or school. Avoid contact with people who are sick during cold and flu season. This is in fall and winter. Get help if: Your symptoms last for 10 days or longer. Your symptoms get worse over time. You have very bad pain in your face or forehead. Parts of your jaw or neck get very swollen. You have shortness of breath. Get help right away if: You feel pain or pressure in your chest. You have trouble breathing. You faint or feel like you will faint. You keep vomiting and it gets worse. You feel confused. These symptoms may be an emergency. Get  help right away. Call your local emergency services (911 in the [place] Do not wait to see if the symptoms will go away. Do not drive yourself to the hospital. Summary A viral respiratory infection is an illness that affects parts of the body that are used for breathing. Examples of this illness include a cold, the flu, and a respiratory syncytial virus (RSV) infection. The infection can cause a runny nose, cough, sore throat, and fever. Follow what your doctor  tells you about taking medicines, drinking lots of fluid, washing your hands, resting at home, and avoiding people who are sick. This information is not intended to replace advice given to you by your health care provider. Make sure you discuss any questions you have with your health care provider. Document Revised: 08/04/2020 Document Reviewed: 08/04/2020 Elsevier Patient Education  2023 [employer].

## 2022-09-28 NOTE — Progress Notes (Signed)
Patient ID: Elizabeth Blackwell, female    DOB: 1951-02-08, 72 y.o.   MRN: [id number]  PCP: Danelle Berry, PA-C  Chief Complaint  Patient presents with   Cough    Ongoing since previous visit   Nasal Congestion    Head congested    Subjective:   Elizabeth Blackwell is a 72 y.o. female, presents to clinic with CC of the following:  HPI  URI sx Monday with a sniffle and gotten much worse, severe nasal drainage, sinus pressure, yellow productive cough Chills  She has not tested for COVID and does not want to go home and test for COVID because she does not feel similar to the last time she had COVID She also reports intermittent diarrhea which occurs with eating green vegetables this has been on and off for about 6 weeks She denies any fever, sweats, pain with breathing, shortness of breath  Patient Active Problem List   Diagnosis Date Noted   Acute appendicitis 07/26/2022   Uncontrolled type 2 diabetes mellitus with hyperglycemia (HCC) 05/24/2022   Arthritis 05/24/2022   Family history of colon cancer requiring screening colonoscopy    Chronic right-sided low back pain with right-sided sciatica 10/16/2021   Other hyperlipidemia 10/16/2021   Morbid obesity with BMI of 40.0-44.9, adult (HCC) 04/04/2016   Diabetes mellitus type II, controlled, with no complications (HCC) 05/25/2014   Essential hypertension 01/01/2007      Current Outpatient Medications:    ibuprofen (ADVIL) 800 MG tablet, Take 1 tablet (800 mg total) by mouth 2 (two) times daily as needed for moderate pain., Disp: 90 tablet, Rfl: 0   lisinopril (ZESTRIL) 40 MG tablet, Take 1 tablet (40 mg total) by mouth daily., Disp: 90 tablet, Rfl: 3   melatonin 5 MG TABS, Take 10 mg by mouth at bedtime., Disp: , Rfl:    neomycin-polymyxin b-dexamethasone (MAXITROL) 3.5-10000-0.1 SUSP, Place 1 drop into the right eye 2 (two) times daily., Disp: , Rfl:    pantoprazole (PROTONIX) 20 MG tablet, Take 1 tablet (20 mg total) by  mouth every morning. One hour before breakfast, Disp: 30 tablet, Rfl: 1   terconazole (TERAZOL 7) 0.4 % vaginal cream, Place 1 applicator vaginally at bedtime., Disp: 45 g, Rfl: 0   TRADJENTA 5 MG TABS tablet, TAKE 1 TABLET EVERY DAY, Disp: 90 tablet, Rfl: 10   ondansetron (ZOFRAN) 4 MG tablet, Take 1 tablet (4 mg total) by mouth every 8 (eight) hours as needed for nausea or vomiting. (Patient not taking: Reported on 09/13/2022), Disp: 20 tablet, Rfl: 0   Allergies  Allergen Reactions   Codeine Phosphate     REACTION: unspecified     Social History   Tobacco Use   Smoking status: Never   Smokeless tobacco: Never  Vaping Use   Vaping Use: Never used  Substance Use Topics   Alcohol use: Yes    Alcohol/week: 1.0 standard drink of alcohol    Types: 1 Cans of beer per week   Drug use: Never      Chart Review Today: I personally reviewed active problem list, medication list, allergies, family history, social history, health maintenance, notes from last encounter, lab results, imaging with the patient/caregiver today.   Review of Systems  Constitutional: Negative.   HENT: Negative.    Eyes: Negative.   Respiratory: Negative.    Cardiovascular: Negative.   Gastrointestinal: Negative.   Endocrine: Negative.   Genitourinary: Negative.   Musculoskeletal: Negative.   Skin: Negative.  Allergic/Immunologic: Negative.   Neurological: Negative.   Hematological: Negative.   Psychiatric/Behavioral: Negative.    All other systems reviewed and are negative.      Objective:   Vitals:   09/28/22 1053  BP: 122/74  Pulse: 100  Resp: 16  Temp: 97.9 F (36.6 C)  TempSrc: Oral  SpO2: 96%  Weight: 201 lb 1.6 oz (91.2 kg)  Height: 5' (1.524 m)    Body mass index is 39.27 kg/m.  Physical Exam Vitals and nursing note reviewed.  Constitutional:      General: She is not in acute distress.    Appearance: Normal appearance. She is well-developed. She is obese. She is not  ill-appearing, toxic-appearing or diaphoretic.  HENT:     Head: Normocephalic and atraumatic.     Right Ear: Hearing, tympanic membrane, ear canal and external ear normal.     Left Ear: Hearing, tympanic membrane, ear canal and external ear normal.     Nose: Mucosal edema, congestion and rhinorrhea present.     Right Sinus: No maxillary sinus tenderness or frontal sinus tenderness.     Left Sinus: No maxillary sinus tenderness or frontal sinus tenderness.     Mouth/Throat:     Mouth: Mucous membranes are moist. Mucous membranes are not pale.     Pharynx: Oropharynx is clear. Uvula midline. Posterior oropharyngeal erythema (mild) present. No oropharyngeal exudate or uvula swelling.     Tonsils: No tonsillar abscesses.  Eyes:     General: No scleral icterus.       Right eye: No discharge.        Left eye: No discharge.     Conjunctiva/sclera: Conjunctivae normal.  Neck:     Trachea: No tracheal deviation.  Cardiovascular:     Rate and Rhythm: Normal rate and regular rhythm.     Pulses: Normal pulses.     Heart sounds: Normal heart sounds.  Pulmonary:     Effort: Pulmonary effort is normal. No respiratory distress.     Breath sounds: Normal breath sounds. No stridor. No wheezing, rhonchi or rales.  Abdominal:     General: Bowel sounds are normal. There is no distension.     Palpations: Abdomen is soft.  Musculoskeletal:        General: Normal range of motion.     Cervical back: Normal range of motion and neck supple.  Skin:    General: Skin is warm and dry.     Coloration: Skin is not pale.     Findings: No rash.  Neurological:     Mental Status: She is alert.     Motor: No abnormal muscle tone.     Coordination: Coordination normal.  Psychiatric:        Mood and Affect: Mood normal.        Behavior: Behavior normal.      Results for orders placed or performed in visit on 09/13/22  POCT rapid strep A  Result Value Ref Range   Rapid Strep A Screen Negative Negative        Assessment & Plan:     ICD-10-CM   1. Upper respiratory tract infection, unspecified type  J06.9      I encouraged the patient to go home and check with a home test for COVID but she declined -I explained that if she did have COVID and she would like Paxlovid she is in the window for this option but she stated she does not feel like she has COVID because  it is different than the last time she had COVID I attempted to explain how the virus mutated send can give different symptoms sometimes even there are asymptomatic cases, or symptoms is mild as suspected allergies and it can be COVID but she continued to decline  She is not having any chest pain, shortness of breath, fever or sweats on exam she has some congestion and nasal discharge and postnasal drip, but she is otherwise well-appearing, lungs are clear, vital signs stable  Is likely a viral upper respiratory infection and I explained to her that the management is watchful waiting for self-limiting virus, supportive and symptomatic measures she was given a list of medication she is safe to try and given a handout on viral infections  Reviewed thing she should watch out for such as acutely worsening signs facial sinus pain and pressure with new fever or shortness of breath with pain with breathing those things need to be rechecked in office and may have a different diagnosis or treatment but at this time suspect viral URI and supportive and symptomatic management is appropriate   Danelle Berry, PA-C 09/28/22 11:21 AM

## 2022-10-29 NOTE — Progress Notes (Unsigned)
Name: Elizabeth Blackwell   MRN: [id number]    DOB: Oct 30, 1950   Date:10/30/2022       Progress Note  Subjective  Chief Complaint  Follow Up  HPI  DM type II with associated dyslipidemia, HTN and obesity: she has been taking Tradjenta for at least one year . Her A1C last year was over 13 %, but it has improved significantly with life style modification. She is also compliant with medications. She denies side effects. A1C today si down to 7.2 % - explained goal A1C for her is around 7 %. She is taking statins and also ACE. BP is at goal, last LDL was 102 and she is willing to take statin therapy now. We will try crestor, discussed possible side effects. She denies polyphagia, polydipsia or polyuria.   Morbid obesity: BMI over 35 with co-morbidities such as DM, HTN and dyslipidemia., she is frustrated due to lack of weight loss even though she has been eating better. Discussed trying a GLP-1 agonist but she does not want to do injectables and does not want to change diabetes medication at this time  OA: chronic knee pain, x-ray showing OA of left hip, leg aches when standing: discussed referral to Ortho and she is willing to go back to [place] [facility]   Patient Active Problem List   Diagnosis Date Noted   Dyslipidemia associated with type 2 diabetes mellitus (HCC) 10/30/2022   Arthritis 05/24/2022   Family history of colon cancer requiring screening colonoscopy    Chronic right-sided low back pain with right-sided sciatica 10/16/2021   Other hyperlipidemia 10/16/2021   Morbid obesity with BMI of 40.0-44.9, adult (HCC) 04/04/2016   Essential hypertension 01/01/2007    Past Surgical History:  Procedure Laterality Date   BACK SURGERY     COLONOSCOPY WITH PROPOFOL     COLONOSCOPY WITH PROPOFOL N/A 12/11/2021   Procedure: COLONOSCOPY WITH PROPOFOL;  Surgeon: Toney Reil, MD;  Location: [facility] ENDOSCOPY;  Service: Gastroenterology;  Laterality: N/A;   XI ROBOTIC LAPAROSCOPIC ASSISTED  APPENDECTOMY N/A 07/26/2022   Procedure: XI ROBOTIC LAPAROSCOPIC ASSISTED APPENDECTOMY;  Surgeon: Henrene Dodge, MD;  Location: [facility] ORS;  Service: General;  Laterality: N/A;    Family History  Problem Relation Age of Onset   Dementia Mother    Diabetes Sister     Social History   Tobacco Use   Smoking status: Never   Smokeless tobacco: Never  Substance Use Topics   Alcohol use: Yes    Alcohol/week: 1.0 standard drink of alcohol    Types: 1 Cans of beer per week     Current Outpatient Medications:    lisinopril (ZESTRIL) 40 MG tablet, Take 1 tablet (40 mg total) by mouth daily., Disp: 90 tablet, Rfl: 3   melatonin 5 MG TABS, Take 10 mg by mouth at bedtime., Disp: , Rfl:    rosuvastatin (CRESTOR) 5 MG tablet, Take 1 tablet (5 mg total) by mouth daily., Disp: 90 tablet, Rfl: 1   terbinafine (LAMISIL) 250 MG tablet, Take 1 tablet (250 mg total) by mouth daily., Disp: 90 tablet, Rfl: 0   TRADJENTA 5 MG TABS tablet, TAKE 1 TABLET EVERY DAY, Disp: 90 tablet, Rfl: 10  Allergies  Allergen Reactions   Codeine Phosphate     REACTION: unspecified    I personally reviewed active problem list, medication list, allergies, family history, social history, health maintenance with the patient/caregiver today.   ROS  Constitutional: Negative for fever or weight change.  Respiratory: Negative for  cough and shortness of breath.   Cardiovascular: Negative for chest pain or palpitations.  Gastrointestinal: Negative for abdominal pain, no bowel changes.  Musculoskeletal: positive  for gait problem or joint swelling.  Skin: Negative for rash.  Neurological: Negative for dizziness or headache.  No other specific complaints in a complete review of systems (except as listed in HPI above).   Objective  Vitals:   10/30/22 0952  BP: 120/72  Pulse: 87  Resp: 16  Temp: 97.8 F (36.6 C)  TempSrc: Oral  SpO2: 94%  Weight: 200 lb 1.6 oz (90.8 kg)  Height: 5' (1.524 m)    Body mass index is  39.08 kg/m.  Physical Exam  Constitutional: Patient appears well-developed and well-nourished. Obese  No distress.  HEENT: head atraumatic, normocephalic, pupils equal and reactive to light, neck supple Cardiovascular: Normal rate, regular rhythm and normal heart sounds.  No murmur heard. No BLE edema. Pulmonary/Chest: Effort normal and breath sounds normal. No respiratory distress. Abdominal: Soft.  There is no tenderness. Psychiatric: Patient has a normal mood and affect. behavior is normal. Judgment and thought content normal.   Recent Results (from the past 2160 hour(s))  POCT rapid strep A     Status: None   Collection Time: 09/13/22  3:22 PM  Result Value Ref Range   Rapid Strep A Screen Negative Negative  POCT HgB A1C     Status: Abnormal   Collection Time: 10/30/22  9:52 AM  Result Value Ref Range   Hemoglobin A1C 7.2 (A) 4.0 - 5.6 %   HbA1c POC (<> result, manual entry)     HbA1c, POC (prediabetic range)     HbA1c, POC (controlled diabetic range)      Diabetic Foot Exam: Diabetic Foot Exam - Simple   Simple Foot Form Visual Inspection See comments: Yes Sensation Testing Intact to touch and monofilament testing bilaterally: Yes Pulse Check Posterior Tibialis and Dorsalis pulse intact bilaterally: Yes Comments Dry skin , onychomycosis        PHQ2/9:    09/28/2022   10:52 AM 09/13/2022    3:17 PM 08/03/2022    1:09 PM 07/25/2022    1:46 PM 05/24/2022    9:42 AM  Depression screen PHQ 2/9  Decreased Interest 0 0 0 0 0  Down, Depressed, Hopeless 0 0 0 0 0  PHQ - 2 Score 0 0 0 0 0  Altered sleeping 0  0 0   Tired, decreased energy 0  0 0   Change in appetite 0  0 0   Feeling bad or failure about yourself  0  0 0   Trouble concentrating 0  0 0   Moving slowly or fidgety/restless 0  0 0   Suicidal thoughts 0  0 0   PHQ-9 Score 0  0 0   Difficult doing work/chores Not difficult at all  Not difficult at all Not difficult at all     phq 9 is negative   Fall  Risk:    10/30/2022    9:46 AM 09/28/2022   10:51 AM 09/13/2022    3:17 PM 08/03/2022    1:05 PM 07/25/2022    1:45 PM  Fall Risk   Falls in the past year? 0 0 0 0 0  Number falls in past yr:  0 0 0 0  Injury with Fall?  0 0 0 0  Risk for fall due to : No Fall Risks No Fall Risks  No Fall Risks No Fall Risks  Follow up Falls prevention discussed Falls prevention discussed;Education provided;Falls evaluation completed  Education provided;Falls prevention discussed Falls prevention discussed;Education provided;Falls evaluation completed      Functional Status Survey: Is the patient deaf or have difficulty hearing?: No Does the patient have difficulty seeing, even when wearing glasses/contacts?: No Does the patient have difficulty concentrating, remembering, or making decisions?: No Does the patient have difficulty walking or climbing stairs?: No Does the patient have difficulty dressing or bathing?: No Does the patient have difficulty doing errands alone such as visiting a doctor's office or shopping?: No    Assessment & Plan  1. Dyslipidemia associated with type 2 diabetes mellitus (HCC)  - POCT HgB A1C - Urine Microalbumin w/creat. ratio - rosuvastatin (CRESTOR) 5 MG tablet; Take 1 tablet (5 mg total) by mouth daily.  Dispense: 90 tablet; Refill: 1  2. Morbid obesity (HCC)  BMI is over 35 with co-morbidities such as HTN and dyslipidemia, plus osteoarthritis   3. Primary osteoarthritis of left hip   4. DDD (degenerative disc disease), lumbar   5. Chronic pain of both knees   6. Need for pneumococcal 20-valent conjugate vaccination  - Pneumococcal conjugate vaccine 20-valent (Prevnar 20)  7. Osteopenia after menopause  - DG Bone Density; Future  8. Breast cancer screening by mammogram  - MM 3D SCREENING MAMMOGRAM BILATERAL BREAST; Future  9. Essential hypertension    10. Onychomycosis of great toe  - terbinafine (LAMISIL) 250 MG tablet; Take 1 tablet (250 mg  total) by mouth daily.  Dispense: 90 tablet; Refill: 0

## 2022-10-30 ENCOUNTER — Encounter: Payer: Self-pay | Admitting: Family Medicine

## 2022-10-30 ENCOUNTER — Ambulatory Visit (INDEPENDENT_AMBULATORY_CARE_PROVIDER_SITE_OTHER): Payer: Medicare HMO | Admitting: Family Medicine

## 2022-10-30 VITALS — BP 120/72 | HR 87 | Temp 97.8°F | Resp 16 | Ht 60.0 in | Wt 200.1 lb

## 2022-10-30 DIAGNOSIS — Z1231 Encounter for screening mammogram for malignant neoplasm of breast: Secondary | ICD-10-CM

## 2022-10-30 DIAGNOSIS — E785 Hyperlipidemia, unspecified: Secondary | ICD-10-CM | POA: Diagnosis not present

## 2022-10-30 DIAGNOSIS — Z23 Encounter for immunization: Secondary | ICD-10-CM | POA: Diagnosis not present

## 2022-10-30 DIAGNOSIS — G8929 Other chronic pain: Secondary | ICD-10-CM

## 2022-10-30 DIAGNOSIS — M1612 Unilateral primary osteoarthritis, left hip: Secondary | ICD-10-CM

## 2022-10-30 DIAGNOSIS — M5136 Other intervertebral disc degeneration, lumbar region: Secondary | ICD-10-CM | POA: Diagnosis not present

## 2022-10-30 DIAGNOSIS — Z780 Asymptomatic menopausal state: Secondary | ICD-10-CM

## 2022-10-30 DIAGNOSIS — E1169 Type 2 diabetes mellitus with other specified complication: Secondary | ICD-10-CM | POA: Diagnosis not present

## 2022-10-30 DIAGNOSIS — E119 Type 2 diabetes mellitus without complications: Secondary | ICD-10-CM | POA: Insufficient documentation

## 2022-10-30 DIAGNOSIS — M25561 Pain in right knee: Secondary | ICD-10-CM | POA: Diagnosis not present

## 2022-10-30 DIAGNOSIS — M8580 Other specified disorders of bone density and structure, unspecified site: Secondary | ICD-10-CM

## 2022-10-30 DIAGNOSIS — Z7984 Long term (current) use of oral hypoglycemic drugs: Secondary | ICD-10-CM | POA: Diagnosis not present

## 2022-10-30 DIAGNOSIS — M25562 Pain in left knee: Secondary | ICD-10-CM

## 2022-10-30 DIAGNOSIS — I152 Hypertension secondary to endocrine disorders: Secondary | ICD-10-CM | POA: Insufficient documentation

## 2022-10-30 DIAGNOSIS — E1165 Type 2 diabetes mellitus with hyperglycemia: Secondary | ICD-10-CM

## 2022-10-30 DIAGNOSIS — I10 Essential (primary) hypertension: Secondary | ICD-10-CM

## 2022-10-30 DIAGNOSIS — B351 Tinea unguium: Secondary | ICD-10-CM

## 2022-10-30 LAB — POCT GLYCOSYLATED HEMOGLOBIN (HGB A1C): Hemoglobin A1C: 7.2 % — AB (ref 4.0–5.6)

## 2022-10-30 MED ORDER — ROSUVASTATIN CALCIUM 5 MG PO TABS
5.0000 mg | ORAL_TABLET | Freq: Every day | ORAL | 1 refills | Status: DC
Start: 2022-10-30 — End: 2023-03-01

## 2022-10-30 MED ORDER — TERBINAFINE HCL 250 MG PO TABS
250.0000 mg | ORAL_TABLET | Freq: Every day | ORAL | 0 refills | Status: DC
Start: 2022-10-30 — End: 2023-03-01

## 2022-10-30 NOTE — Patient Instructions (Signed)
At your local pharmacy please get Tdap and shingrix vaccine

## 2022-10-31 LAB — MICROALBUMIN / CREATININE URINE RATIO
Creatinine, Urine: 94 mg/dL (ref 20–275)
Microalb Creat Ratio: 11 mg/g creat (ref <30)
Microalb, Ur: 1 mg/dL

## 2022-11-14 DIAGNOSIS — M7061 Trochanteric bursitis, right hip: Secondary | ICD-10-CM | POA: Diagnosis not present

## 2022-11-23 DIAGNOSIS — M5416 Radiculopathy, lumbar region: Secondary | ICD-10-CM | POA: Diagnosis not present

## 2022-12-14 DIAGNOSIS — M7062 Trochanteric bursitis, left hip: Secondary | ICD-10-CM | POA: Diagnosis not present

## 2022-12-14 DIAGNOSIS — M7061 Trochanteric bursitis, right hip: Secondary | ICD-10-CM | POA: Diagnosis not present

## 2022-12-14 DIAGNOSIS — M47896 Other spondylosis, lumbar region: Secondary | ICD-10-CM | POA: Diagnosis not present

## 2022-12-14 DIAGNOSIS — M5459 Other low back pain: Secondary | ICD-10-CM | POA: Diagnosis not present

## 2022-12-19 DIAGNOSIS — M545 Low back pain, unspecified: Secondary | ICD-10-CM | POA: Diagnosis not present

## 2022-12-20 DIAGNOSIS — M5459 Other low back pain: Secondary | ICD-10-CM | POA: Diagnosis not present

## 2022-12-20 DIAGNOSIS — M47896 Other spondylosis, lumbar region: Secondary | ICD-10-CM | POA: Diagnosis not present

## 2022-12-20 DIAGNOSIS — M7062 Trochanteric bursitis, left hip: Secondary | ICD-10-CM | POA: Diagnosis not present

## 2022-12-20 DIAGNOSIS — M7061 Trochanteric bursitis, right hip: Secondary | ICD-10-CM | POA: Diagnosis not present

## 2022-12-26 ENCOUNTER — Ambulatory Visit
Admission: RE | Admit: 2022-12-26 | Discharge: 2022-12-26 | Disposition: A | Discharge status: Home / Self Care | Payer: Medicare HMO | Source: Ambulatory Visit | Attending: Family Medicine | Admitting: Family Medicine

## 2022-12-26 DIAGNOSIS — Z1231 Encounter for screening mammogram for malignant neoplasm of breast: Secondary | ICD-10-CM | POA: Diagnosis not present

## 2022-12-26 DIAGNOSIS — Z780 Asymptomatic menopausal state: Secondary | ICD-10-CM | POA: Insufficient documentation

## 2022-12-26 DIAGNOSIS — M8580 Other specified disorders of bone density and structure, unspecified site: Secondary | ICD-10-CM | POA: Insufficient documentation

## 2022-12-26 DIAGNOSIS — M85851 Other specified disorders of bone density and structure, right thigh: Secondary | ICD-10-CM | POA: Diagnosis not present

## 2022-12-28 DIAGNOSIS — M47896 Other spondylosis, lumbar region: Secondary | ICD-10-CM | POA: Diagnosis not present

## 2022-12-28 DIAGNOSIS — M7061 Trochanteric bursitis, right hip: Secondary | ICD-10-CM | POA: Diagnosis not present

## 2022-12-28 DIAGNOSIS — M7062 Trochanteric bursitis, left hip: Secondary | ICD-10-CM | POA: Diagnosis not present

## 2022-12-28 DIAGNOSIS — M5459 Other low back pain: Secondary | ICD-10-CM | POA: Diagnosis not present

## 2023-01-15 DIAGNOSIS — M5459 Other low back pain: Secondary | ICD-10-CM | POA: Diagnosis not present

## 2023-01-15 DIAGNOSIS — M7061 Trochanteric bursitis, right hip: Secondary | ICD-10-CM | POA: Diagnosis not present

## 2023-01-15 DIAGNOSIS — M47896 Other spondylosis, lumbar region: Secondary | ICD-10-CM | POA: Diagnosis not present

## 2023-01-15 DIAGNOSIS — M7062 Trochanteric bursitis, left hip: Secondary | ICD-10-CM | POA: Diagnosis not present

## 2023-01-22 DIAGNOSIS — M7061 Trochanteric bursitis, right hip: Secondary | ICD-10-CM | POA: Diagnosis not present

## 2023-01-22 DIAGNOSIS — M7062 Trochanteric bursitis, left hip: Secondary | ICD-10-CM | POA: Diagnosis not present

## 2023-01-22 DIAGNOSIS — M47896 Other spondylosis, lumbar region: Secondary | ICD-10-CM | POA: Diagnosis not present

## 2023-01-22 DIAGNOSIS — M5459 Other low back pain: Secondary | ICD-10-CM | POA: Diagnosis not present

## 2023-01-28 DIAGNOSIS — M7062 Trochanteric bursitis, left hip: Secondary | ICD-10-CM | POA: Diagnosis not present

## 2023-01-28 DIAGNOSIS — M5459 Other low back pain: Secondary | ICD-10-CM | POA: Diagnosis not present

## 2023-01-28 DIAGNOSIS — M7061 Trochanteric bursitis, right hip: Secondary | ICD-10-CM | POA: Diagnosis not present

## 2023-01-28 DIAGNOSIS — M47896 Other spondylosis, lumbar region: Secondary | ICD-10-CM | POA: Diagnosis not present

## 2023-02-28 NOTE — Progress Notes (Signed)
Name: Elizabeth Blackwell   MRN: [id number]    DOB: 04-18-1951   Date:03/01/2023       Progress Note  Subjective  Chief Complaint  Follow Up  HPI  DM type II with associated dyslipidemia, HTN and obesity: She has tried Metformin and it caused diarrhea, Rybelsus caused abdominal pain, she has been taking trajenta and tolerates medication well.  Her A1C  back in June 2023 it was up to  13 %, but it has improved significantly with life style modification.  Since that time A1C has been between 7.2 % and 7.7 %  She is taking statins and also ACE.  She was taking Crestor but ran out of medication. We will switch from Trajenta to [facility] - Jardiance/Trajenta combo . Discussed possible side effects   Morbid obesity: BMI over 35 with co-morbidities such as DM, HTN and dyslipidemia., she is frustrated due to lack of weight loss even though she has been eating better. She is going to water aerobics and we will add Jardiance  OA/Chronic back pain : chronic knee pain, x-ray showing OA of left hip, leg aches when standing: she states she continues to have pain, uses ice, exercises and takes Tylenol prn   Patient Active Problem List   Diagnosis Date Noted   Osteopenia after menopause 03/01/2023   Obesity, diabetes, and hypertension syndrome (HCC) 10/30/2022   Arthritis 05/24/2022   Family history of colon cancer requiring screening colonoscopy    Chronic right-sided low back pain with right-sided sciatica 10/16/2021   Other hyperlipidemia 10/16/2021   Morbid obesity with BMI of 40.0-44.9, adult (HCC) 04/04/2016   Essential hypertension 01/01/2007    Past Surgical History:  Procedure Laterality Date   BACK SURGERY     COLONOSCOPY WITH PROPOFOL     COLONOSCOPY WITH PROPOFOL N/A 12/11/2021   Procedure: COLONOSCOPY WITH PROPOFOL;  Surgeon: Toney Reil, MD;  Location: [facility];  Service: Gastroenterology;  Laterality: N/A;   XI ROBOTIC LAPAROSCOPIC ASSISTED APPENDECTOMY N/A 07/26/2022    Procedure: XI ROBOTIC LAPAROSCOPIC ASSISTED APPENDECTOMY;  Surgeon: Henrene Dodge, MD;  Location: [facility] ORS;  Service: General;  Laterality: N/A;    Family History  Problem Relation Age of Onset   Dementia Mother    Diabetes Sister     Social History   Tobacco Use   Smoking status: Never   Smokeless tobacco: Never  Substance Use Topics   Alcohol use: Yes    Alcohol/week: 1.0 standard drink of alcohol    Types: 1 Cans of beer per week     Current Outpatient Medications:    Empagliflozin-linaGLIPtin (GLYXAMBI) 25-5 MG TABS, Take 1 tablet by mouth every morning. In place of Trajenta, Disp: 90 tablet, Rfl: 1   melatonin 5 MG TABS, Take 10 mg by mouth at bedtime., Disp: , Rfl:    lisinopril (ZESTRIL) 40 MG tablet, Take 1 tablet (40 mg total) by mouth daily., Disp: 90 tablet, Rfl: 1   rosuvastatin (CRESTOR) 5 MG tablet, Take 1 tablet (5 mg total) by mouth daily., Disp: 90 tablet, Rfl: 1  Allergies  Allergen Reactions   Codeine Phosphate     REACTION: unspecified   Metformin And Related Diarrhea    I personally reviewed active problem list, medication list, allergies, family history, social history, health maintenance with the patient/caregiver today.   ROS  Ten systems reviewed and is negative except as mentioned in HPI    Objective  Vitals:   03/01/23 0948  BP: 118/74  Pulse: 94  Resp: 16  Temp: 98 F (36.7 C)  TempSrc: Oral  SpO2: 100%  Weight: 205 lb 6.4 oz (93.2 kg)  Height: 5' (1.524 m)    Body mass index is 40.11 kg/m.  Physical Exam  Constitutional: Patient appears well-developed and well-nourished. Obese  No distress.  HEENT: head atraumatic, normocephalic, pupils equal and reactive to light, neck supple, throat within normal limits Cardiovascular: Normal rate, regular rhythm and normal heart sounds.  No murmur heard. No BLE edema. Pulmonary/Chest: Effort normal and breath sounds normal. No respiratory distress. Abdominal: Soft.  There is no  tenderness. Psychiatric: Patient has a normal mood and affect. behavior is normal. Judgment and thought content normal.   Recent Results (from the past 2160 hour(s))  POCT HgB A1C     Status: Abnormal   Collection Time: 03/01/23  9:51 AM  Result Value Ref Range   Hemoglobin A1C 7.7 (A) 4.0 - 5.6 %   HbA1c POC (<> result, manual entry)     HbA1c, POC (prediabetic range)     HbA1c, POC (controlled diabetic range)      Diabetic Foot Exam: Diabetic Foot Exam - Simple   Simple Foot Form Visual Inspection See comments: Yes Sensation Testing Intact to touch and monofilament testing bilaterally: Yes Pulse Check Posterior Tibialis and Dorsalis pulse intact bilaterally: Yes Comments Onychomycosis       PHQ2/9:    03/01/2023    9:49 AM 09/28/2022   10:52 AM 09/13/2022    3:17 PM 08/03/2022    1:09 PM 07/25/2022    1:46 PM  Depression screen PHQ 2/9  Decreased Interest 0 0 0 0 0  Down, Depressed, Hopeless 0 0 0 0 0  PHQ - 2 Score 0 0 0 0 0  Altered sleeping 0 0  0 0  Tired, decreased energy 0 0  0 0  Change in appetite 0 0  0 0  Feeling bad or failure about yourself  0 0  0 0  Trouble concentrating 0 0  0 0  Moving slowly or fidgety/restless 0 0  0 0  Suicidal thoughts 0 0  0 0  PHQ-9 Score 0 0  0 0  Difficult doing work/chores  Not difficult at all  Not difficult at all Not difficult at all    phq 9 is negative   Fall Risk:    03/01/2023    9:49 AM 10/30/2022    9:46 AM 09/28/2022   10:51 AM 09/13/2022    3:17 PM 08/03/2022    1:05 PM  Fall Risk   Falls in the past year? 0 0 0 0 0  Number falls in past yr:   0 0 0  Injury with Fall?   0 0 0  Risk for fall due to : No Fall Risks No Fall Risks No Fall Risks  No Fall Risks  Follow up Falls prevention discussed Falls prevention discussed Falls prevention discussed;Education provided;Falls evaluation completed  Education provided;Falls prevention discussed      Functional Status Survey: Is the patient deaf or have  difficulty hearing?: No Does the patient have difficulty seeing, even when wearing glasses/contacts?: No Does the patient have difficulty concentrating, remembering, or making decisions?: No Does the patient have difficulty walking or climbing stairs?: No Does the patient have difficulty dressing or bathing?: No Does the patient have difficulty doing errands alone such as visiting a doctor's office or shopping?: No    Assessment & Plan  1. Dyslipidemia associated with type 2 diabetes mellitus (  HCC)  - POCT HgB A1C - [facility] Diabetes Foot Exam - rosuvastatin (CRESTOR) 5 MG tablet; Take 1 tablet (5 mg total) by mouth daily.  Dispense: 90 tablet; Refill: 1  2. Morbid obesity (HCC)  Discussed with the patient the risk posed by an increased BMI. Discussed importance of portion control, calorie counting and at least 150 minutes of physical activity weekly. Avoid sweet beverages and drink more water. Eat at least 6 servings of fruit and vegetables daily    3. Obesity, diabetes, and hypertension syndrome (HCC)  Continue medications   4. Chronic bilateral low back pain with bilateral sciatica   Stable

## 2023-03-01 ENCOUNTER — Ambulatory Visit: Payer: Medicare HMO | Admitting: Family Medicine

## 2023-03-01 ENCOUNTER — Encounter: Payer: Self-pay | Admitting: Family Medicine

## 2023-03-01 VITALS — BP 118/74 | HR 94 | Temp 98.0°F | Resp 16 | Ht 60.0 in | Wt 205.4 lb

## 2023-03-01 DIAGNOSIS — E785 Hyperlipidemia, unspecified: Secondary | ICD-10-CM | POA: Diagnosis not present

## 2023-03-01 DIAGNOSIS — M5441 Lumbago with sciatica, right side: Secondary | ICD-10-CM

## 2023-03-01 DIAGNOSIS — E1169 Type 2 diabetes mellitus with other specified complication: Secondary | ICD-10-CM

## 2023-03-01 DIAGNOSIS — Z780 Asymptomatic menopausal state: Secondary | ICD-10-CM | POA: Insufficient documentation

## 2023-03-01 DIAGNOSIS — I152 Hypertension secondary to endocrine disorders: Secondary | ICD-10-CM

## 2023-03-01 DIAGNOSIS — G8929 Other chronic pain: Secondary | ICD-10-CM | POA: Diagnosis not present

## 2023-03-01 DIAGNOSIS — E1159 Type 2 diabetes mellitus with other circulatory complications: Secondary | ICD-10-CM

## 2023-03-01 DIAGNOSIS — M5442 Lumbago with sciatica, left side: Secondary | ICD-10-CM | POA: Diagnosis not present

## 2023-03-01 DIAGNOSIS — Z23 Encounter for immunization: Secondary | ICD-10-CM

## 2023-03-01 DIAGNOSIS — E669 Obesity, unspecified: Secondary | ICD-10-CM

## 2023-03-01 LAB — POCT GLYCOSYLATED HEMOGLOBIN (HGB A1C): Hemoglobin A1C: 7.7 % — AB (ref 4.0–5.6)

## 2023-03-01 MED ORDER — LISINOPRIL 40 MG PO TABS: 40.0000 mg | ORAL_TABLET | Freq: Every day | ORAL | 1 refills | Status: DC

## 2023-03-01 MED ORDER — ROSUVASTATIN CALCIUM 5 MG PO TABS
5.0000 mg | ORAL_TABLET | Freq: Every day | ORAL | 1 refills | Status: DC
Start: 2023-03-01 — End: 2023-09-27

## 2023-03-01 MED ORDER — GLYXAMBI 25-5 MG PO TABS: 1.0000 | ORAL_TABLET | ORAL | 1 refills | Status: DC

## 2023-03-08 ENCOUNTER — Telehealth: Payer: Self-pay | Admitting: Family Medicine

## 2023-03-08 NOTE — Telephone Encounter (Signed)
Pt called in says her prescriptions have been filled

## 2023-03-08 NOTE — Telephone Encounter (Signed)
Patient called stated her medication has been placed on hold at the pharmacy [facility] and she wanted Dr Carlynn Purl to know. Patient wasn't sure of the name.

## 2023-03-19 NOTE — Progress Notes (Unsigned)
Name: Elizabeth Blackwell   MRN: [id number]    DOB: [birth year]   Date:03/20/2023       Progress Note  Subjective  Chief Complaint  Arthritis  HPI  DDD lumbar spine: she states three nights ago her pain was intense. Low back radiating to both thighs, throbbing like and caused inability for her to sleep. She states today pain is down to 5/10. No bowel or bladder incontinence. She has NsAID's at home, brought in a bottle of diclofenac and one of meloxicam. Explained they are the same class of mediation and has to take only one or the other. We will try Lyrica to see if controls radiculitis symptoms   Patient Active Problem List   Diagnosis Date Noted   Osteopenia after menopause 03/01/2023   Obesity, diabetes, and hypertension syndrome (HCC) 10/30/2022   Arthritis 05/24/2022   Family history of colon cancer requiring screening colonoscopy    Chronic right-sided low back pain with right-sided sciatica 10/16/2021   Other hyperlipidemia 10/16/2021   Morbid obesity with BMI of 40.0-44.9, adult (HCC) 04/04/2016   Essential hypertension 01/01/2007    Past Surgical History:  Procedure Laterality Date   BACK SURGERY     COLONOSCOPY WITH PROPOFOL     COLONOSCOPY WITH PROPOFOL N/A 12/11/2021   Procedure: COLONOSCOPY WITH PROPOFOL;  Surgeon: Toney Reil, MD;  Location: [facility];  Service: Gastroenterology;  Laterality: N/A;   XI ROBOTIC LAPAROSCOPIC ASSISTED APPENDECTOMY N/A 07/26/2022   Procedure: XI ROBOTIC LAPAROSCOPIC ASSISTED APPENDECTOMY;  Surgeon: Henrene Dodge, MD;  Location: [facility] ORS;  Service: General;  Laterality: N/A;    Family History  Problem Relation Age of Onset   Dementia Mother    Diabetes Sister     Social History   Tobacco Use   Smoking status: Never   Smokeless tobacco: Never  Substance Use Topics   Alcohol use: Yes    Alcohol/week: 1.0 standard drink of alcohol    Types: 1 Cans of beer per week     Current Outpatient Medications:     Empagliflozin-linaGLIPtin (GLYXAMBI) 25-5 MG TABS, Take 1 tablet by mouth every morning. In place of Trajenta, Disp: 90 tablet, Rfl: 1   lisinopril (ZESTRIL) 40 MG tablet, Take 1 tablet (40 mg total) by mouth daily., Disp: 90 tablet, Rfl: 1   melatonin 5 MG TABS, Take 10 mg by mouth at bedtime., Disp: , Rfl:    pregabalin (LYRICA) 50 MG capsule, Take 1-2 capsules (50-100 mg total) by mouth at bedtime., Disp: 60 capsule, Rfl: 0   rosuvastatin (CRESTOR) 5 MG tablet, Take 1 tablet (5 mg total) by mouth daily., Disp: 90 tablet, Rfl: 1  Allergies  Allergen Reactions   Codeine Phosphate     REACTION: unspecified   Metformin And Related Diarrhea    I personally reviewed active problem list, medication list, allergies, family history, social history, health maintenance with the patient/caregiver today.   ROS  Ten systems reviewed and is negative except as mentioned in HPI    Objective  Vitals:   03/20/23 1316  BP: 118/72  Pulse: 87  Resp: 16  Temp: 97.7 F (36.5 C)  TempSrc: Oral  SpO2: 100%  Weight: 200 lb 8 oz (90.9 kg)  Height: 5' (1.524 m)    Body mass index is 39.16 kg/m.  Physical Exam  Constitutional: Patient appears well-developed and well-nourished. Obese  No distress.  HEENT: head atraumatic, normocephalic, pupils equal and reactive to light, neck supple, throat within normal limits Cardiovascular: Normal rate, regular  rhythm and normal heart sounds.  No murmur heard. No BLE edema. Pulmonary/Chest: Effort normal and breath sounds normal. No respiratory distress. Muscular skeletal: pain during palpation of both outer thighs and lower back, negative straight leg raise.  Abdominal: Soft.  There is no tenderness. Psychiatric: Patient has a normal mood and affect. behavior is normal. Judgment and thought content normal.    PHQ2/9:    03/20/2023    1:20 PM 03/01/2023    9:49 AM 09/28/2022   10:52 AM 09/13/2022    3:17 PM 08/03/2022    1:09 PM  Depression screen PHQ 2/9   Decreased Interest 0 0 0 0 0  Down, Depressed, Hopeless 0 0 0 0 0  PHQ - 2 Score 0 0 0 0 0  Altered sleeping 0 0 0  0  Tired, decreased energy 0 0 0  0  Change in appetite 0 0 0  0  Feeling bad or failure about yourself  0 0 0  0  Trouble concentrating 0 0 0  0  Moving slowly or fidgety/restless 0 0 0  0  Suicidal thoughts 0 0 0  0  PHQ-9 Score 0 0 0  0  Difficult doing work/chores   Not difficult at all  Not difficult at all    phq 9 is negative    Assessment & Plan  1. Chronic bilateral low back pain with bilateral sciatica  - pregabalin (LYRICA) 50 MG capsule; Take 1-2 capsules (50-100 mg total) by mouth at bedtime.  Dispense: 60 capsule; Refill: 0   Reviewed old MRI, discussed referral to psyatrist of trochanteric bursa injection but she would like to hold off for now

## 2023-03-20 ENCOUNTER — Ambulatory Visit (INDEPENDENT_AMBULATORY_CARE_PROVIDER_SITE_OTHER): Payer: Medicare HMO | Admitting: Family Medicine

## 2023-03-20 ENCOUNTER — Encounter: Payer: Self-pay | Admitting: Family Medicine

## 2023-03-20 VITALS — BP 118/72 | HR 87 | Temp 97.7°F | Resp 16 | Ht 60.0 in | Wt 200.5 lb

## 2023-03-20 DIAGNOSIS — M5441 Lumbago with sciatica, right side: Secondary | ICD-10-CM

## 2023-03-20 DIAGNOSIS — M5442 Lumbago with sciatica, left side: Secondary | ICD-10-CM | POA: Diagnosis not present

## 2023-03-20 DIAGNOSIS — G8929 Other chronic pain: Secondary | ICD-10-CM

## 2023-03-20 MED ORDER — PREGABALIN 50 MG PO CAPS
50.0000 mg | ORAL_CAPSULE | Freq: Every evening | ORAL | 0 refills | Status: DC
Start: 2023-03-20 — End: 2023-03-26

## 2023-03-25 ENCOUNTER — Encounter: Payer: Self-pay | Admitting: Family Medicine

## 2023-03-26 ENCOUNTER — Other Ambulatory Visit: Payer: Self-pay | Admitting: Family Medicine

## 2023-03-26 DIAGNOSIS — G8929 Other chronic pain: Secondary | ICD-10-CM

## 2023-03-26 MED ORDER — GABAPENTIN 100 MG PO CAPS
100.0000 mg | ORAL_CAPSULE | Freq: Three times a day (TID) | ORAL | 0 refills | Status: DC
Start: 2023-03-26 — End: 2023-07-12

## 2023-04-02 DIAGNOSIS — H179 Unspecified corneal scar and opacity: Secondary | ICD-10-CM | POA: Diagnosis not present

## 2023-04-02 DIAGNOSIS — Z0100 Encounter for examination of eyes and vision without abnormal findings: Secondary | ICD-10-CM | POA: Diagnosis not present

## 2023-04-02 DIAGNOSIS — E119 Type 2 diabetes mellitus without complications: Secondary | ICD-10-CM | POA: Diagnosis not present

## 2023-04-02 DIAGNOSIS — M3501 Sicca syndrome with keratoconjunctivitis: Secondary | ICD-10-CM | POA: Diagnosis not present

## 2023-04-02 LAB — HM DIABETES EYE EXAM

## 2023-04-16 ENCOUNTER — Encounter: Payer: Self-pay | Admitting: Family Medicine

## 2023-07-02 ENCOUNTER — Ambulatory Visit: Payer: Self-pay | Admitting: Family Medicine

## 2023-07-12 ENCOUNTER — Ambulatory Visit (INDEPENDENT_AMBULATORY_CARE_PROVIDER_SITE_OTHER): Payer: Medicare Other | Admitting: Family Medicine

## 2023-07-12 ENCOUNTER — Encounter: Payer: Self-pay | Admitting: Family Medicine

## 2023-07-12 DIAGNOSIS — I152 Hypertension secondary to endocrine disorders: Secondary | ICD-10-CM | POA: Diagnosis not present

## 2023-07-12 DIAGNOSIS — M5442 Lumbago with sciatica, left side: Secondary | ICD-10-CM

## 2023-07-12 DIAGNOSIS — M5441 Lumbago with sciatica, right side: Secondary | ICD-10-CM | POA: Diagnosis not present

## 2023-07-12 DIAGNOSIS — I10 Essential (primary) hypertension: Secondary | ICD-10-CM

## 2023-07-12 DIAGNOSIS — M150 Primary generalized (osteo)arthritis: Secondary | ICD-10-CM

## 2023-07-12 DIAGNOSIS — E785 Hyperlipidemia, unspecified: Secondary | ICD-10-CM

## 2023-07-12 DIAGNOSIS — Z7984 Long term (current) use of oral hypoglycemic drugs: Secondary | ICD-10-CM

## 2023-07-12 DIAGNOSIS — E1169 Type 2 diabetes mellitus with other specified complication: Secondary | ICD-10-CM | POA: Diagnosis not present

## 2023-07-12 DIAGNOSIS — B370 Candidal stomatitis: Secondary | ICD-10-CM

## 2023-07-12 DIAGNOSIS — G8929 Other chronic pain: Secondary | ICD-10-CM

## 2023-07-12 DIAGNOSIS — E669 Obesity, unspecified: Secondary | ICD-10-CM

## 2023-07-12 DIAGNOSIS — E1159 Type 2 diabetes mellitus with other circulatory complications: Secondary | ICD-10-CM

## 2023-07-12 MED ORDER — LISINOPRIL 40 MG PO TABS: 40.0000 mg | ORAL_TABLET | Freq: Every day | ORAL | 1 refills | Status: DC

## 2023-07-12 MED ORDER — GLYXAMBI 25-5 MG PO TABS: 1.0000 | ORAL_TABLET | ORAL | 1 refills | Status: DC

## 2023-07-12 MED ORDER — NYSTATIN 100000 UNIT/ML MT SUSP: 5.0000 mL | Freq: Four times a day (QID) | OROMUCOSAL | 0 refills | Status: DC

## 2023-07-12 NOTE — Progress Notes (Signed)
 Name: Elizabeth Blackwell   MRN: [id number]    DOB: [birth year]   Date:07/12/2023       Progress Note  Subjective  Chief Complaint  Chief Complaint  Patient presents with   white bumps     On tongue for a week    Discussed the use of AI scribe software for clinical note transcription with the patient, who gave verbal consent to proceed.  History of Present Illness   Elizabeth Blackwell is a 73 year old female with type 2 diabetes, dyslipidemia, and hypertension who presents with white spots on her tongue.  White spots on the tongue appeared on Monday, preceding cold-like symptoms such as sneezing and a burning sensation in the nose. She is currently taking cold medications. No runny nose is present. She also experiences itching in her ears without any associated rash.  Her type 2 diabetes is managed with Glyxambi, a combination of Jardiance and Tradjenta, which she tolerates well. Previous medications, metformin and Rybelsus, diarrhea and abdominal pain, respectively. Her last hemoglobin A1c was 7.7, and she is due for a follow-up test today. She follows a Mediterranean diet after switching from [place] has gained some weight since She denies polyphagia, polydipsia or polyuria. She has been compliant with diabetes medication, statin for dyslipidemia, and ACE for HTN  For dyslipidemia, she takes rosuvastatin daily without side effects. Her blood pressure is managed with lisinopril 40 mg daily, with a current reading of 138/78 mmHg.  She experiences chronic back pain and osteoarthritis, particularly in her left hip and knees. She has tried physical therapy, heat application, and Tylenol for pain management. Her pain varies, sometimes reaching a 7 out of 10, and it shifts between her back and legs. She is not currently taking gabapentin, which was previously prescribed for her back pain. Denies radiculitis at this time   Her BMI is over 40, indicating morbid obesity. She engages in physical  activities such as Zumba and water aerobics weekly. She has not tried Weight Watchers but is considering it for portion control.        Patient Active Problem List   Diagnosis Date Noted   Osteopenia after menopause 03/01/2023   Obesity, diabetes, and hypertension syndrome (HCC) 10/30/2022   Arthritis 05/24/2022   Family history of colon cancer requiring screening colonoscopy    Chronic right-sided low back pain with right-sided sciatica 10/16/2021   Other hyperlipidemia 10/16/2021   Morbid obesity with BMI of 40.0-44.9, adult (HCC) 04/04/2016   Essential hypertension 01/01/2007    Social History   Tobacco Use   Smoking status: Never   Smokeless tobacco: Never  Substance Use Topics   Alcohol use: Yes    Alcohol/week: 1.0 standard drink of alcohol    Types: 1 Cans of beer per week     Current Outpatient Medications:    Empagliflozin-linaGLIPtin (GLYXAMBI) 25-5 MG TABS, Take 1 tablet by mouth every morning. In place of Trajenta, Disp: 90 tablet, Rfl: 1   gabapentin (NEURONTIN) 100 MG capsule, Take 1 capsule (100 mg total) by mouth 3 (three) times daily., Disp: 90 capsule, Rfl: 0   lisinopril (ZESTRIL) 40 MG tablet, Take 1 tablet (40 mg total) by mouth daily., Disp: 90 tablet, Rfl: 1   melatonin 5 MG TABS, Take 10 mg by mouth at bedtime., Disp: , Rfl:    rosuvastatin (CRESTOR) 5 MG tablet, Take 1 tablet (5 mg total) by mouth daily., Disp: 90 tablet, Rfl: 1  Allergies  Allergen Reactions   Codeine Phosphate  REACTION: unspecified   Metformin And Related Diarrhea    ROS  Ten systems reviewed and is negative except as mentioned in HPI    Objective  Vitals:   07/12/23 1059  BP: 138/78  Pulse: 88  Resp: 16  Temp: (!) 97.4 F (36.3 C)  TempSrc: Oral  SpO2: 97%  Weight: 207 lb 4.8 oz (94 kg)  Height: 5' (1.524 m)    Body mass index is 40.49 kg/m.    Physical Exam  Constitutional: Patient appears well-developed and well-nourished. Obese  No distress.   HEENT: head atraumatic, normocephalic, pupils equal and reactive to light, ears normal external ear canal and TM, neck supple, throat within normal limits, oral mucosa showed some white spots on tongue Cardiovascular: Normal rate, regular rhythm and normal heart sounds.  No murmur heard. No BLE edema. Pulmonary/Chest: Effort normal and breath sounds normal. No respiratory distress. Abdominal: Soft.  There is no tenderness. Psychiatric: Patient has a normal mood and affect. behavior is normal. Judgment and thought content normal.    Assessment and Plan    Oral Lesions White spots on tongue noticed on Monday, with subsequent development of cold symptoms. No pain or irritation, mild itchiness. -Prescribe Nystatin oral rinse, swish and spit.  Type 2 Diabetes Mellitus with associated HTN, dyslipidemia and obesity Last HbA1c was 7.7. Patient has had adverse reactions to Metformin and Rybelsus. Currently on Glyxambi (Jardiance + Tradjenta combo) with no reported side effects. -Check HbA1c today. -Continue Glyxambi.  Dyslipidemia Patient is on Rosuvastatin (Crestor) with no reported side effects. -Continue Rosuvastatin. -Check lipid panel today.  Hypertension Blood pressure is 138/78 on Lisinopril 40mg  daily. -Continue Lisinopril 40mg  daily.  Chronic Back Pain Pain level fluctuates, sometimes reaching 7/10. Patient uses heat for relief and has tried physical therapy. -Advise regular use of Tylenol 650mg  up to 4 times daily for pain control.  Morbid Obesity Patient has tried keto diet and is now attempting a Mediterranean diet. BMI is over 40. -Encourage continuation of Mediterranean diet and regular exercise.  Ear pruritus Patient reports severe itching in ears. No visible rash. -Advise trial of over-the-counter loratadine (Claritin) for potential allergy-related itching.  Follow-up in 4 months.

## 2023-07-13 LAB — COMPLETE METABOLIC PANEL WITH GFR
AG Ratio: 1.5 (calc) (ref 1.0–2.5)
ALT: 17 U/L (ref 6–29)
AST: 16 U/L (ref 10–35)
Albumin: 4.4 g/dL (ref 3.6–5.1)
Alkaline phosphatase (APISO): 90 U/L (ref 37–153)
BUN: 15 mg/dL (ref 7–25)
CO2: 26 mmol/L (ref 20–32)
Calcium: 9.5 mg/dL (ref 8.6–10.4)
Chloride: 104 mmol/L (ref 98–110)
Creat: 0.64 mg/dL (ref 0.60–1.00)
Globulin: 2.9 g/dL (ref 1.9–3.7)
Glucose, Bld: 116 mg/dL — ABNORMAL HIGH (ref 65–99)
Potassium: 4.7 mmol/L (ref 3.5–5.3)
Sodium: 140 mmol/L (ref 135–146)
Total Bilirubin: 0.3 mg/dL (ref 0.2–1.2)
Total Protein: 7.3 g/dL (ref 6.1–8.1)
eGFR: 94 mL/min/{1.73_m2} (ref >=60)

## 2023-07-13 LAB — HEMOGLOBIN A1C
Hgb A1c MFr Bld: 7.9 %{Hb} — ABNORMAL HIGH (ref <5.7)
Mean Plasma Glucose: 180 mg/dL
eAG (mmol/L): 10 mmol/L

## 2023-07-13 LAB — CBC WITH DIFFERENTIAL/PLATELET
Absolute Lymphocytes: 2507 {cells}/uL (ref 850–3900)
Absolute Monocytes: 554 {cells}/uL (ref 200–950)
Basophils Absolute: 32 {cells}/uL (ref 0–200)
Basophils Relative: 0.5 %
Eosinophils Absolute: 139 {cells}/uL (ref 15–500)
Eosinophils Relative: 2.2 %
HCT: 48.3 % — ABNORMAL HIGH (ref 35.0–45.0)
Hemoglobin: 15.4 g/dL (ref 11.7–15.5)
MCH: 29.4 pg (ref 27.0–33.0)
MCHC: 31.9 g/dL — ABNORMAL LOW (ref 32.0–36.0)
MCV: 92.2 fL (ref 80.0–100.0)
MPV: 11.9 fL (ref 7.5–12.5)
Monocytes Relative: 8.8 %
Neutro Abs: 3068 {cells}/uL (ref 1500–7800)
Neutrophils Relative %: 48.7 %
Platelets: 204 10*3/uL (ref 140–400)
RBC: 5.24 10*6/uL — ABNORMAL HIGH (ref 3.80–5.10)
RDW: 12 % (ref 11.0–15.0)
Total Lymphocyte: 39.8 %
WBC: 6.3 10*3/uL (ref 3.8–10.8)

## 2023-07-13 LAB — LIPID PANEL
Cholesterol: 107 mg/dL (ref <200)
HDL: 45 mg/dL — ABNORMAL LOW (ref >=50)
LDL Cholesterol (Calc): 49 mg/dL
Non-HDL Cholesterol (Calc): 62 mg/dL (ref <130)
Total CHOL/HDL Ratio: 2.4 (calc) (ref <5.0)
Triglycerides: 51 mg/dL (ref <150)

## 2023-07-15 ENCOUNTER — Encounter: Payer: Self-pay | Admitting: Family Medicine

## 2023-07-17 ENCOUNTER — Other Ambulatory Visit: Payer: Self-pay | Admitting: Family Medicine

## 2023-07-17 DIAGNOSIS — B370 Candidal stomatitis: Secondary | ICD-10-CM

## 2023-07-17 MED ORDER — NYSTATIN 100000 UNIT/ML MT SUSP: 5.0000 mL | Freq: Four times a day (QID) | OROMUCOSAL | 0 refills | Status: DC

## 2023-07-31 ENCOUNTER — Ambulatory Visit: Payer: Self-pay | Admitting: Family Medicine

## 2023-07-31 ENCOUNTER — Other Ambulatory Visit: Payer: Self-pay

## 2023-07-31 ENCOUNTER — Encounter: Payer: Self-pay | Admitting: Internal Medicine

## 2023-07-31 ENCOUNTER — Ambulatory Visit (INDEPENDENT_AMBULATORY_CARE_PROVIDER_SITE_OTHER): Admitting: Internal Medicine

## 2023-07-31 VITALS — BP 128/82 | HR 96 | Temp 97.9°F | Resp 16 | Ht 60.0 in | Wt 202.0 lb

## 2023-07-31 DIAGNOSIS — B370 Candidal stomatitis: Secondary | ICD-10-CM

## 2023-07-31 MED ORDER — FLUCONAZOLE 100 MG PO TABS: ORAL_TABLET | ORAL | 0 refills | Status: DC

## 2023-07-31 NOTE — Telephone Encounter (Signed)
 Reason for Disposition  [1] White patches that stick to tongue or inner cheek AND [2] can be wiped off  Answer Assessment - Initial Assessment Questions 1. SYMPTOM: "What's the main symptom you're concerned about?" (e.g., chapped lips, dry mouth, lump, sores)     White patches on tongue 2. ONSET: "When did the  irritation  start?"     2 weeks 3. PAIN: "Is there any pain?" If Yes, ask: "How bad is it?" (Scale: 1-10; mild, moderate, severe)   - MILD (1-3):  doesn't interfere with eating or normal activities   - MODERATE (4-7): interferes with eating some solids and normal activities   - SEVERE (8-10):  excruciating pain, interferes with most normal activities   - SEVERE DYSPHAGIA: can't swallow liquids, drooling     Every once in a while lips itch- no pain 4. CAUSE: "What do you think is causing the symptoms?"     Diagnosed with thrush 5. OTHER SYMPTOMS: "Do you have any other symptoms?" (e.g., fever, sore throat, toothache, swelling)     Seems to be spreading- treatment did not help  Protocols used: Mouth Symptoms-A-AH

## 2023-07-31 NOTE — Progress Notes (Signed)
   Acute Office Visit  Subjective:     Patient ID: Elizabeth Blackwell, female    DOB: 1950-11-02, 73 y.o.   MRN: [id number]  Chief Complaint  Patient presents with   Elizabeth Blackwell    HPI Patient is in today for white patches in throat.  She was here about 2 weeks ago for the same problem but it was more under her tongue and in the front part of her mouth.  She was given nystatin oral suspension but was switching the medication and not swallowing.  She did get a refill of this medication and was taking appropriately, however she states she still has the white patches in her mouth, now more on the back of the tongue.  She denies any pain, painful swallowing or trouble swallowing.  Review of Systems  All other systems reviewed and are negative.       Objective:    BP 128/82 (Cuff Size: Large)   Pulse 96   Temp 97.9 F (36.6 C) (Oral)   Resp 16   Ht 5' (1.524 m)   Wt 202 lb (91.6 kg)   SpO2 98%   BMI 39.45 kg/m    Physical Exam Constitutional:      Appearance: Normal appearance.  HENT:     Head: Normocephalic and atraumatic.  Eyes:     Conjunctiva/sclera: Conjunctivae normal.  Skin:    General: Skin is warm and dry.     Comments: Mild oral thrush on the back of the tongue  Neurological:     General: No focal deficit present.     Mental Status: She is alert. Mental status is at baseline.  Psychiatric:        Mood and Affect: Mood normal.        Behavior: Behavior normal.     No results found for any visits on 07/31/23.      Assessment & Plan:   1. Oral candidiasis (Primary): Offered to refill Nystatin oral suspension, patient would prefer oral tablets. Will prescribe Diflucan.  - fluconazole (DIFLUCAN) 100 MG tablet; Take 200 mg the first day, followed by 100 mg daily for 7 days.  Dispense: 8 tablet; Refill: 0   Return if symptoms worsen or fail to improve.  Elizabeth Mail, DO

## 2023-07-31 NOTE — Telephone Encounter (Signed)
  Chief Complaint: Elizabeth Blackwell Symptoms: patient states she was recently treated for thrush- not better- seems to be spreading  Frequency: 2 weeks Pertinent Negatives: Patient denies pain Disposition: [] ED /[] Urgent Care (no appt availability in office) / [x] Appointment(In office/virtual)/ []  [facility]/ [] Home Care/ [] Refused Recommended Disposition /[] [place]/ []  Follow-up with PCP Additional Notes: Follow up appointment scheduled.

## 2023-08-01 ENCOUNTER — Other Ambulatory Visit: Payer: Self-pay | Admitting: Family Medicine

## 2023-08-01 DIAGNOSIS — E1169 Type 2 diabetes mellitus with other specified complication: Secondary | ICD-10-CM

## 2023-08-01 DIAGNOSIS — E669 Obesity, unspecified: Secondary | ICD-10-CM

## 2023-08-01 DIAGNOSIS — I10 Essential (primary) hypertension: Secondary | ICD-10-CM

## 2023-08-08 ENCOUNTER — Ambulatory Visit (INDEPENDENT_AMBULATORY_CARE_PROVIDER_SITE_OTHER)

## 2023-08-08 VITALS — Ht 60.0 in | Wt 200.0 lb

## 2023-08-08 DIAGNOSIS — Z0000 Encounter for general adult medical examination without abnormal findings: Secondary | ICD-10-CM | POA: Diagnosis not present

## 2023-08-08 NOTE — Progress Notes (Unsigned)
 Please attest and cosign this visit due to patients primary care provider not being in the office at the time the visit was completed.  Because this visit was a virtual/telehealth visit,  certain criteria was not obtained, such a blood pressure, CBG if applicable, and timed get up and go. Any medications not marked as "taking" were not mentioned during the medication reconciliation part of the visit. Any vitals not documented were not able to be obtained due to this being a telehealth visit or patient was unable to self-report a recent blood pressure reading due to a lack of equipment at home via telehealth. Vitals that have been documented are verbally provided by the patient.   Subjective:   Elizabeth Blackwell is a 73 y.o. who presents for a Medicare Wellness preventive visit.  Visit Complete: Virtual I connected with  Elizabeth Blackwell on 08/08/23 by a audio enabled telemedicine application and verified that I am speaking with the correct person using two identifiers.  Patient Location: Home  Provider Location: Office/Clinic  I discussed the limitations of evaluation and management by telemedicine. The patient expressed understanding and agreed to proceed.  Vital Signs: Because this visit was a virtual/telehealth visit, some criteria may be missing or patient reported. Any vitals not documented were not able to be obtained and vitals that have been documented are patient reported.  VideoDeclined- This patient declined Librarian, academic. Therefore the visit was completed with audio only.  Persons Participating in Visit: Patient.  AWV Questionnaire: Yes: Patient Medicare AWV questionnaire was completed by the patient on 08/07/2023; I have confirmed that all information answered by patient is correct and no changes since this date.  Cardiac Risk Factors include: advanced age (>39men, >10 women);diabetes mellitus;hypertension;obesity (BMI >30kg/m2);sedentary  lifestyle     Objective:    Today's Vitals   08/08/23 1339  Weight: 200 lb (90.7 kg)  Height: 5' (1.524 m)   Body mass index is 39.06 kg/m.     08/08/2023    1:51 PM 08/03/2022    1:12 PM 07/26/2022    5:35 PM 07/26/2022    2:51 PM 12/11/2021    8:03 AM 08/01/2021    1:51 PM  Advanced Directives  Does Patient Have a Medical Advance Directive? Yes Yes Yes Yes No No  Type of Estate agent of [place];Living will  Living will Living will    Does patient want to make changes to medical advance directive? No - Patient declined  No - Guardian declined     Copy of Healthcare Power of Attorney in Chart? Yes - validated most recent copy scanned in chart (See row information)       Would patient like information on creating a medical advance directive?      No - Patient declined    Current Medications (verified) Outpatient Encounter Medications as of 08/08/2023  Medication Sig   Empagliflozin-linaGLIPtin (GLYXAMBI) 25-5 MG TABS Take 1 tablet by mouth every morning. In place of Trajenta   fluconazole (DIFLUCAN) 100 MG tablet Take 200 mg the first day, followed by 100 mg daily for 7 days.   lisinopril (ZESTRIL) 40 MG tablet Take 1 tablet (40 mg total) by mouth daily.   melatonin 5 MG TABS Take 10 mg by mouth at bedtime.   nystatin (MYCOSTATIN) 100000 UNIT/ML suspension Take 5 mLs (500,000 Units total) by mouth 4 (four) times daily.   rosuvastatin (CRESTOR) 5 MG tablet Take 1 tablet (5 mg total) by mouth daily.  No facility-administered encounter medications on file as of 08/08/2023.    Allergies (verified) Codeine phosphate and Metformin and related   History: Past Medical History:  Diagnosis Date   Diabetes mellitus without complication (HCC)    Hypertension    Past Surgical History:  Procedure Laterality Date   BACK SURGERY     COLONOSCOPY WITH PROPOFOL     COLONOSCOPY WITH PROPOFOL N/A 12/11/2021   Procedure: COLONOSCOPY WITH PROPOFOL;  Surgeon: Toney Reil, MD;  Location: [facility] ENDOSCOPY;  Service: Gastroenterology;  Laterality: N/A;   XI ROBOTIC LAPAROSCOPIC ASSISTED APPENDECTOMY N/A 07/26/2022   Procedure: XI ROBOTIC LAPAROSCOPIC ASSISTED APPENDECTOMY;  Surgeon: Henrene Dodge, MD;  Location: [facility] ORS;  Service: General;  Laterality: N/A;   Family History  Problem Relation Age of Onset   Dementia Mother    Diabetes Sister    Social History   Socioeconomic History   Marital status: Divorced    Spouse name: Not on file   Number of children: 2   Years of education: Not on file   Highest education level: 12th grade  Occupational History   Occupation: retired    Comment: used to work [employer]  Tobacco Use   Smoking status: Never   Smokeless tobacco: Never  Vaping Use   Vaping status: Never Used  Substance and Sexual Activity   Alcohol use: Yes    Alcohol/week: 1.0 standard drink of alcohol    Types: 1 Cans of beer per week   Drug use: Never   Sexual activity: Not Currently  Other Topics Concern   Not on file  Social History Narrative   Pt lives alone   Social Drivers of Health   Financial Resource Strain: Low Risk  (08/07/2023)   Overall Financial Resource Strain (CARDIA)    Difficulty of Paying Living Expenses: Not hard at all  Food Insecurity: No Food Insecurity (08/07/2023)   Hunger Vital Sign    Worried About Running Out of Food in the Last Year: Never true    Ran Out of Food in the Last Year: Never true  Transportation Needs: No Transportation Needs (08/07/2023)   PRAPARE - Administrator, Civil Service (Medical): No    Lack of Transportation (Non-Medical): No  Physical Activity: Sufficiently Active (08/07/2023)   Exercise Vital Sign    Days of Exercise per Week: 3 days    Minutes of Exercise per Session: 50 min  Stress: No Stress Concern Present (08/07/2023)   [employer] of Occupational Health - Occupational Stress Questionnaire    Feeling of Stress : Not at all  Social Connections:  Moderately Integrated (08/07/2023)   Social Connection and Isolation Panel [NHANES]    Frequency of Communication with Friends and Family: More than three times a week    Frequency of Social Gatherings with Friends and Family: More than three times a week    Attends Religious Services: More than 4 times per year    Active Member of [employer] or Organizations: Yes    Attends Engineer, structural: More than 4 times per year    Marital Status: Divorced    Tobacco Counseling Counseling given: Yes    Clinical Intake:  Pre-visit preparation completed: Yes  Pain : No/denies pain     BMI - recorded: 39.06 Nutritional Status: BMI > 30  Obese Nutritional Risks: None Diabetes: Yes CBG done?: No Did pt. bring in CBG monitor from home?: No  Lab Results  Component Value Date   HGBA1C 7.9 (  H) 07/12/2023   HGBA1C 7.7 (A) 03/01/2023   HGBA1C 7.2 (A) 10/30/2022     How often do you need to have someone help you when you read instructions, pamphlets, or other written materials from your doctor or pharmacy?: 1 - Never  Interpreter Needed?: No  Information entered by :: Elizabeth Blackwell CMA   Activities of Daily Living     08/07/2023    3:17 PM 03/20/2023    1:20 PM  In your present state of health, do you have any difficulty performing the following activities:  Hearing? 0 0  Vision? 0 0  Difficulty concentrating or making decisions? 0 0  Walking or climbing stairs? 0 1  Dressing or bathing? 0 0  Doing errands, shopping? 0 0  Preparing Food and eating ? N   Using the Toilet? N   In the past six months, have you accidently leaked urine? N   Do you have problems with loss of bowel control? N   Managing your Medications? N   Managing your Finances? N   Housekeeping or managing your Housekeeping? N     Patient Care Team: Alba Cory, MD as PCP - General (Family Medicine) Linus Galas, DPM (Podiatry) Dingeldein, Viviann Spare, MD (Ophthalmology) Pa,  [facility]  [facility])  Indicate any recent Medical Services you may have received from other than [facility] providers in the past year (date may be approximate).     Assessment:   This is a routine wellness examination for Elizabeth Blackwell.  Hearing/Vision screen Hearing Screening - Comments:: Patient denies any hearing difficulties.   Vision Screening - Comments:: Wears rx glasses - up to date with routine eye exams  Patient [facility] a provider at [facility] for yearly exams.    Goals Addressed             This Visit's Progress    Patient Stated       I'd like to get my garden up and going       Depression Screen     08/08/2023    2:01 PM 07/12/2023   10:57 AM 03/20/2023    1:20 PM 03/01/2023    9:49 AM 09/28/2022   10:52 AM 09/13/2022    3:17 PM 08/03/2022    1:09 PM  PHQ 2/9 Scores  PHQ - 2 Score 0 0 0 0 0 0 0  PHQ- 9 Score 0 0 0 0 0  0    Fall Risk     08/07/2023    3:17 PM 07/31/2023   10:46 AM 07/12/2023   10:50 AM 03/20/2023    1:20 PM 03/01/2023    9:49 AM  Fall Risk   Falls in the past year? 1 0 0 0 0  Number falls in past yr: 0 0 0    Injury with Fall? 0 0 0    Risk for fall due to : No Fall Risks No Fall Risks No Fall Risks No Fall Risks No Fall Risks  Follow up Falls prevention discussed;Education provided Falls evaluation completed Falls prevention discussed;Education provided;Falls evaluation completed Falls prevention discussed Falls prevention discussed    MEDICARE RISK AT HOME:  Medicare Risk at Home Any stairs in or around the home?: (Patient-Rptd) Yes If so, are there any without handrails?: (Patient-Rptd) No Home free of loose throw rugs in walkways, pet beds, electrical cords, etc?: (Patient-Rptd) Yes Adequate lighting in your home to reduce risk of falls?: (Patient-Rptd) Yes Life alert?: (Patient-Rptd) No Use of a cane, walker or w/c?: (  Patient-Rptd) No Grab bars in the bathroom?: (Patient-Rptd) Yes Shower chair or bench in shower?: (Patient-Rptd)  No Elevated toilet seat or a handicapped toilet?: (Patient-Rptd) No  TIMED UP AND GO:  Was the test performed?  No  Cognitive Function: 6CIT completed        08/08/2023    1:59 PM 08/03/2022    1:17 PM  6CIT Screen  What Year? 0 points 0 points  What month? 0 points 0 points  What time? 0 points 0 points  Count back from 20 0 points 0 points  Months in reverse 0 points 0 points  Repeat phrase 0 points 0 points  Total Score 0 points 0 points    Immunizations Immunization History  Administered Date(s) Administered   Fluad Quad(high Dose 65+) 02/26/2023   Influenza-Unspecified 03/08/2021   PFIZER(Purple Top)SARS-COV-2 Vaccination 07/31/2019, 08/25/2019, 06/18/2020   PNEUMOCOCCAL CONJUGATE-20 10/30/2022   Tdap 01/10/2023    Screening Tests Health Maintenance  Topic Date Due   COVID-19 Vaccine (4 - 2024-25 season) 01/13/2023   Diabetic kidney evaluation - Urine ACR  10/30/2023   MAMMOGRAM  12/26/2023   HEMOGLOBIN A1C  01/09/2024   FOOT EXAM  02/29/2024   OPHTHALMOLOGY EXAM  04/01/2024   Diabetic kidney evaluation - eGFR measurement  07/11/2024   Medicare Annual Wellness (AWV)  08/07/2024   DEXA SCAN  12/25/2024   Colonoscopy  12/12/2026   DTaP/Tdap/Td (2 - Td or Tdap) 01/09/2033   Pneumonia Vaccine 82+ Years old  Completed   INFLUENZA VACCINE  Completed   Hepatitis C Screening  Completed   Zoster Vaccines- Shingrix  Completed   HPV VACCINES  Aged Out    Health Maintenance  Health Maintenance Due  Topic Date Due   COVID-19 Vaccine (4 - 2024-25 season) 01/13/2023   Health Maintenance Items Addressed: Health Maintenance is up to date.  Tdap updated in chart as per [facility] Patient aware she is due for Covid booster and where to get that done.   Additional Screening:  Vision Screening: Recommended annual ophthalmology exams for early detection of glaucoma and other disorders of the eye. Wears rx glasses - up to date with routine eye exams  Sees [facility] for yearly exams. Dental Screening: Recommended annual dental exams for proper oral hygiene Patient provided with a list of dentist.  Community Resource Referral / Chronic Care Management: CRR required this visit?  No   CCM required this visit?  No     Plan:     I have personally reviewed and noted the following in the patient's chart:   Medical and social history Use of alcohol, tobacco or illicit drugs  Current medications and supplements including opioid prescriptions. Patient is not currently taking opioid prescriptions. Functional ability and status Nutritional status Physical activity Advanced directives List of other physicians Hospitalizations, surgeries, and ER visits in previous 12 months Vitals Screenings to include cognitive, depression, and falls Referrals and appointments  In addition, I have reviewed and discussed with patient certain preventive protocols, quality metrics, and best practice recommendations. A written personalized care plan for preventive services as well as general preventive health recommendations were provided to patient.     Jordan Hawks Kamylah Manzo, CMA   08/08/2023   After Visit Summary: (MyChart) Due to this being a telephonic visit, the after visit summary with patients personalized plan was offered to patient via MyChart   Notes: Nothing significant to report at this time.

## 2023-08-08 NOTE — Patient Instructions (Signed)
 Elizabeth Blackwell , Thank you for taking time to come for your Medicare Wellness Visit. I appreciate your ongoing commitment to your health goals. Please review the following plan we discussed and let me know if I can assist you in the future.   Referrals/Orders/Follow-Ups/Clinician Recommendations:  Next Medicare Annual Wellness Visit: August 14, 2024 at 10:10 am video visit.   Here are a list of dentist resources for you:  [facility] & [place] DDS PA Gwendlyn Deutscher JR [address]  [place] [address] [phone]  [facility] GARY  DESALVO [address]  [place] [address] [phone]  [facility] DDS PA CHRISTOPHER  [place] [address]  [place] [address] [phone]  JENSEN SCOTT SCOTT  JENSEN [address]  [place] [address] [phone]  JOSEPH L MILLER DDS JOSEPH  MILLER [address] [place] [address] [phone]  [facility] DAVID  MOHORN [address] [place] [address] [phone]  [employer] [facility] PA TODD  [place] [address] [place] [address] [phone]  [facility] SU SHAWANA  PATTER[place] [address] [place] [address] [phone]  STEFAN JOHN SIMONCIC DDS [facility] STEFAN  SIMONCIC [address] [place] [address] [phone]  [facility] MARK MARK  KALEY [address]  [place] [address] [phone]  ALISON J MCMILLIAN DDS MS PA ALISON  MCMILLIAN [address] [place] [address] [phone]  Kathi Simpers, DDS MS PLLC MATTHEW  OLMSTED [address] [place] [address] [phone]  Lazarus Gowda MCMILLIAN DDS MS PA [facility] JR [address] [place] [address] [phone]  [facility] [place] PED DENT REBECCA  ANDREWS [address]  [place] [address] [phone]  PERRY L JEFFRIES DDS PA SAMANTHA  BRITT [address]  [place] [address] [phone]  COBB H  BRYAN DDS MS HERMAN  COBB [address] [place] [address] [phone]  STEPHANIE P LINDSAY DDS MS PA KRISTINA  COFFIELD [address]  [place] [address] [phone]  [facility] AND ROHLFING DDS PL MARC  [place] [address]  [place] [address] [phone]  THANE C. HISAW, DMD, PA THANE  HISAW [address] [place] [address] [phone]  STEVEN L HATCHER, DDS, PA SONA  ISHARANI [address] [place]  [address] [phone]  Lazarus Gowda [facility] DDS MS PA Chrissie Noa  PENRY JR [address]  [place] [address] [phone]     This is a list of the screening recommended for you and due dates:  Health Maintenance  Topic Date Due   COVID-19 Vaccine (4 - 2024-25 season) 01/13/2023   Yearly kidney health urinalysis for diabetes  10/30/2023   Mammogram  12/26/2023   Hemoglobin A1C  01/09/2024   Complete foot exam   02/29/2024   Eye exam for diabetics  04/01/2024   Yearly kidney function blood test for diabetes  07/11/2024   Medicare Annual Wellness Visit  08/07/2024   DEXA scan (bone density measurement)  12/25/2024   Colon Cancer Screening  12/12/2026   DTaP/Tdap/Td vaccine (2 - Td or Tdap) 01/09/2033   Pneumonia  Vaccine  Completed   Flu Shot  Completed   Hepatitis C Screening  Completed   Zoster (Shingles) Vaccine  Completed   HPV Vaccine  Aged Out    Advanced directives: (In Chart) A copy of your advanced directives are scanned into your chart should your provider ever need it.  Next Medicare Annual Wellness Visit scheduled for next year: yes  Understanding Your Risk for Falls Millions of people have serious injuries from falls each year. It is important to understand your risk of falling. Talk with your health care provider about your risk and what you can do to lower it. If you do have a serious fall, make sure to tell your provider. Falling once raises your risk of falling again. How can falls affect  me? Serious injuries from falls are common. These include: Broken bones, such as hip fractures. Head injuries, such as traumatic brain injuries (TBI) or concussions. A fear of falling can cause you to avoid activities and stay at home. This can make your muscles weaker and raise your risk for a fall. What can increase my risk? There are a number of risk factors that increase your risk for falling. The more risk factors you have, the higher your risk of falling. Serious injuries from a fall happen most often to people who are older than 72 years old. Teenagers and young adults ages 63-29 are also at higher risk. Common risk factors include: Weakness in the lower body. Being generally weak or confused due to long-term (chronic) illness. Dizziness or balance problems. Poor vision. Medicines that cause dizziness or drowsiness. These may include: Medicines for your blood pressure, heart, anxiety, insomnia, or swelling (edema). Pain medicines. Muscle relaxants. Other risk factors include: Drinking alcohol. Having had a fall in the past. Having foot pain or wearing improper footwear. Working at a dangerous job. Having any of the following in your home: Tripping hazards, such as floor clutter or loose rugs. Poor lighting. Pets. Having dementia or memory loss. What actions can I take to lower my risk of falling?     Physical activity Stay physically fit. Do strength and balance exercises. Consider taking a regular class to build strength and balance. Yoga and tai chi are good options. Vision Have your eyes checked every year and your prescription for glasses or contacts updated as needed. Shoes and walking aids Wear non-skid shoes. Wear shoes that have rubber soles and low heels. Do not wear high heels. Do not walk around the house in socks or slippers. Use a cane or walker as told by your provider. Home safety Attach secure railings on both sides of your stairs. Install grab bars  for your bathtub, shower, and toilet. Use a non-skid mat in your bathtub or shower. Attach bath mats securely with double-sided, non-slip rug tape. Use good lighting in all rooms. Keep a flashlight near your bed. Make sure there is a clear path from your bed to the bathroom. Use night-lights. Do not use throw rugs. Make sure all carpeting is taped or tacked down securely. Remove all clutter from walkways and stairways, including extension cords. Repair uneven or broken steps and floors. Avoid walking on icy or slippery surfaces. Walk on the grass instead of on icy or slick sidewalks. Use ice melter to get rid of ice on walkways in the winter. Use a cordless phone. Questions to ask your health care provider Can you help me check my risk for a fall? Do any of my medicines make me more  likely to fall? Should I take a vitamin D supplement? What exercises can I do to improve my strength and balance? Should I make an appointment to have my vision checked? Do I need a bone density test to check for weak bones (osteoporosis)? Would it help to use a cane or a walker? Where to find more information Centers for Disease Control and Prevention, STEADI: TonerPromos.no Community-Based Fall Prevention Programs: TonerPromos.no [employer] on Aging: BaseRingTones.pl Contact a health care provider if: You fall at home. You are afraid of falling at home. You feel weak, drowsy, or dizzy. This information is not intended to replace advice given to you by your health care provider. Make sure you discuss any questions you have with your health care provider. Document Revised: 01/01/2022 Document Reviewed: 01/01/2022 Elsevier Patient Education  2024 [employer].

## 2023-08-08 NOTE — Progress Notes (Signed)
 Please attest and cosign this visit due to patients primary care provider not being in the office at the time the visit was completed.  Because this visit was a virtual/telehealth visit,  certain criteria was not obtained, such a blood pressure, CBG if applicable, and timed get up and go. Any medications not marked as "taking" were not mentioned during the medication reconciliation part of the visit. Any vitals not documented were not able to be obtained due to this being a telehealth visit or patient was unable to self-report a recent blood pressure reading due to a lack of equipment at home via telehealth. Vitals that have been documented are verbally provided by the patient.   Subjective:   Elizabeth Blackwell is a 73 y.o. who presents for a Medicare Wellness preventive visit.  Visit Complete: Virtual I connected with  Elizabeth Blackwell on 08/08/23 by a audio enabled telemedicine application and verified that I am speaking with the correct person using two identifiers.  Patient Location: Home  Provider Location: Home Office  I discussed the limitations of evaluation and management by telemedicine. The patient expressed understanding and agreed to proceed.  Vital Signs: Because this visit was a virtual/telehealth visit, some criteria may be missing or patient reported. Any vitals not documented were not able to be obtained and vitals that have been documented are patient reported.  VideoDeclined- This patient declined Librarian, academic. Therefore the visit was completed with audio only.  Persons Participating in Visit: Patient.  AWV Questionnaire: Yes: Patient Medicare AWV questionnaire was completed by the patient on 08/07/2023; I have confirmed that all information answered by patient is correct and no changes since this date.        Objective:    Today's Vitals   08/08/23 1411  Weight: 200 lb (90.7 kg)  Height: 5' (1.524 m)   Body mass index is 39.06  kg/m.     08/08/2023    1:51 PM 08/03/2022    1:12 PM 07/26/2022    5:35 PM 07/26/2022    2:51 PM 12/11/2021    8:03 AM 08/01/2021    1:51 PM  Advanced Directives  Does Patient Have a Medical Advance Directive? Yes Yes Yes Yes No No  Type of Estate agent of [place];Living will  Living will Living will    Does patient want to make changes to medical advance directive? No - Patient declined  No - Guardian declined     Copy of Healthcare Power of Attorney in Chart? Yes - validated most recent copy scanned in chart (See row information)       Would patient like information on creating a medical advance directive?      No - Patient declined    Current Medications (verified) Outpatient Encounter Medications as of 08/08/2023  Medication Sig   Empagliflozin-linaGLIPtin (GLYXAMBI) 25-5 MG TABS Take 1 tablet by mouth every morning. In place of Trajenta   fluconazole (DIFLUCAN) 100 MG tablet Take 200 mg the first day, followed by 100 mg daily for 7 days.   lisinopril (ZESTRIL) 40 MG tablet Take 1 tablet (40 mg total) by mouth daily.   melatonin 5 MG TABS Take 10 mg by mouth at bedtime.   nystatin (MYCOSTATIN) 100000 UNIT/ML suspension Take 5 mLs (500,000 Units total) by mouth 4 (four) times daily.   rosuvastatin (CRESTOR) 5 MG tablet Take 1 tablet (5 mg total) by mouth daily.   No facility-administered encounter medications on file as of 08/08/2023.  Allergies (verified) Codeine phosphate and Metformin and related   History: Past Medical History:  Diagnosis Date   Diabetes mellitus without complication (HCC)    Hypertension    Past Surgical History:  Procedure Laterality Date   BACK SURGERY     COLONOSCOPY WITH PROPOFOL     COLONOSCOPY WITH PROPOFOL N/A 12/11/2021   Procedure: COLONOSCOPY WITH PROPOFOL;  Surgeon: Toney Reil, MD;  Location: [facility] ENDOSCOPY;  Service: Gastroenterology;  Laterality: N/A;   XI ROBOTIC LAPAROSCOPIC ASSISTED APPENDECTOMY N/A  07/26/2022   Procedure: XI ROBOTIC LAPAROSCOPIC ASSISTED APPENDECTOMY;  Surgeon: Henrene Dodge, MD;  Location: [facility] ORS;  Service: General;  Laterality: N/A;   Family History  Problem Relation Age of Onset   Dementia Mother    Diabetes Sister    Social History   Socioeconomic History   Marital status: Divorced    Spouse name: Not on file   Number of children: 2   Years of education: Not on file   Highest education level: 12th grade  Occupational History   Occupation: retired    Comment: used to work [employer]  Tobacco Use   Smoking status: Never   Smokeless tobacco: Never  Vaping Use   Vaping status: Never Used  Substance and Sexual Activity   Alcohol use: Yes    Alcohol/week: 1.0 standard drink of alcohol    Types: 1 Cans of beer per week   Drug use: Never   Sexual activity: Not Currently  Other Topics Concern   Not on file  Social History Narrative   Pt lives alone   Social Drivers of Health   Financial Resource Strain: Low Risk  (08/07/2023)   Overall Financial Resource Strain (CARDIA)    Difficulty of Paying Living Expenses: Not hard at all  Food Insecurity: No Food Insecurity (08/07/2023)   Hunger Vital Sign    Worried About Running Out of Food in the Last Year: Never true    Ran Out of Food in the Last Year: Never true  Transportation Needs: No Transportation Needs (08/07/2023)   PRAPARE - Administrator, Civil Service (Medical): No    Lack of Transportation (Non-Medical): No  Physical Activity: Sufficiently Active (08/07/2023)   Exercise Vital Sign    Days of Exercise per Week: 3 days    Minutes of Exercise per Session: 50 min  Stress: No Stress Concern Present (08/07/2023)   [employer] of Occupational Health - Occupational Stress Questionnaire    Feeling of Stress : Not at all  Social Connections: Moderately Integrated (08/07/2023)   Social Connection and Isolation Panel [NHANES]    Frequency of Communication with Friends and Family: More than  three times a week    Frequency of Social Gatherings with Friends and Family: More than three times a week    Attends Religious Services: More than 4 times per year    Active Member of Clubs or Organizations: Yes    Attends Banker Meetings: More than 4 times per year    Marital Status: Divorced    Tobacco Counseling Counseling given: Yes    Clinical Intake:              Lab Results  Component Value Date   HGBA1C 7.9 (H) 07/12/2023   HGBA1C 7.7 (A) 03/01/2023   HGBA1C 7.2 (A) 10/30/2022               Activities of Daily Living     08/07/2023    3:17 PM  03/20/2023    1:20 PM  In your present state of health, do you have any difficulty performing the following activities:  Hearing? 0 0  Vision? 0 0  Difficulty concentrating or making decisions? 0 0  Walking or climbing stairs? 0 1  Dressing or bathing? 0 0  Doing errands, shopping? 0 0  Preparing Food and eating ? N   Using the Toilet? N   In the past six months, have you accidently leaked urine? N   Do you have problems with loss of bowel control? N   Managing your Medications? N   Managing your Finances? N   Housekeeping or managing your Housekeeping? N     Patient Care Team: Alba Cory, MD as PCP - General (Family Medicine) Linus Galas, DPM (Podiatry) Dingeldein, Viviann Spare, MD (Ophthalmology) [facility], [facility])  Indicate any recent Medical Services you may have received from other than [facility] providers in the past year (date may be approximate).     Assessment:   This is a routine wellness examination for [place].  Hearing/Vision screen Hearing Screening - Comments:: Patient denies any hearing difficulties.   Vision Screening - Comments:: Wears rx glasses - up to date with routine eye exams  Patient [facility]   Goals Addressed   None    Depression Screen     08/08/2023    2:01 PM 07/12/2023   10:57 AM 03/20/2023    1:20  PM 03/01/2023    9:49 AM 09/28/2022   10:52 AM 09/13/2022    3:17 PM 08/03/2022    1:09 PM  PHQ 2/9 Scores  PHQ - 2 Score 0 0 0 0 0 0 0  PHQ- 9 Score 0 0 0 0 0  0    Fall Risk     08/07/2023    3:17 PM 07/31/2023   10:46 AM 07/12/2023   10:50 AM 03/20/2023    1:20 PM 03/01/2023    9:49 AM  Fall Risk   Falls in the past year? 1 0 0 0 0  Number falls in past yr: 0 0 0    Injury with Fall? 0 0 0    Risk for fall due to : No Fall Risks No Fall Risks No Fall Risks No Fall Risks No Fall Risks  Follow up Falls prevention discussed;Education provided Falls evaluation completed Falls prevention discussed;Education provided;Falls evaluation completed Falls prevention discussed Falls prevention discussed    MEDICARE RISK AT HOME:  Medicare Risk at Home Any stairs in or around the home?: Yes If so, are there any without handrails?: No Home free of loose throw rugs in walkways, pet beds, electrical cords, etc?: Yes Adequate lighting in your home to reduce risk of falls?: Yes Life alert?: No Use of a cane, walker or w/c?: No Grab bars in the bathroom?: Yes Shower chair or bench in shower?: No Elevated toilet seat or a handicapped toilet?: No  TIMED UP AND GO:  Was the test performed?  No  Cognitive Function: 6CIT completed        08/08/2023    1:59 PM 08/03/2022    1:17 PM  6CIT Screen  What Year? 0 points 0 points  What month? 0 points 0 points  What time? 0 points 0 points  Count back from 20 0 points 0 points  Months in reverse 0 points 0 points  Repeat phrase 0 points 0 points  Total Score 0 points 0 points    Immunizations  Immunization History  Administered Date(s) Administered   Fluad Quad(high Dose 65+) 02/26/2023   Influenza-Unspecified 03/08/2021   PFIZER(Purple Top)SARS-COV-2 Vaccination 07/31/2019, 08/25/2019, 06/18/2020   PNEUMOCOCCAL CONJUGATE-20 10/30/2022   Tdap 01/10/2023    Screening Tests Health Maintenance  Topic Date Due   COVID-19 Vaccine (4 -  2024-25 season) 01/13/2023   Diabetic kidney evaluation - Urine ACR  10/30/2023   MAMMOGRAM  12/26/2023   HEMOGLOBIN A1C  01/09/2024   FOOT EXAM  02/29/2024   OPHTHALMOLOGY EXAM  04/01/2024   Diabetic kidney evaluation - eGFR measurement  07/11/2024   Medicare Annual Wellness (AWV)  08/07/2024   DEXA SCAN  12/25/2024   Colonoscopy  12/12/2026   DTaP/Tdap/Td (2 - Td or Tdap) 01/09/2033   Pneumonia Vaccine 2+ Years old  Completed   INFLUENZA VACCINE  Completed   Hepatitis C Screening  Completed   Zoster Vaccines- Shingrix  Completed   HPV VACCINES  Aged Out    Health Maintenance  Health Maintenance Due  Topic Date Due   COVID-19 Vaccine (4 - 2024-25 season) 01/13/2023   Health Maintenance Items Addressed: Health Maintenance is up to date.  Tdap undated in chart as per [facility] Patient aware she is due for a Covid Booster and where she can get that done at  Additional Screening:  Vision Screening: Recommended annual ophthalmology exams for early detection of glaucoma and other disorders of the eye.  Dental Screening: Recommended annual dental exams for proper oral hygiene Patient provided with a list of dental resources in the community Community Resource Referral / Chronic Care Management: CRR required this visit?  No   CCM required this visit?  No     Plan:     I have personally reviewed and noted the following in the patient's chart:   Medical and social history Use of alcohol, tobacco or illicit drugs  Current medications and supplements including opioid prescriptions. Patient is not currently taking opioid prescriptions. Functional ability and status Nutritional status Physical activity Advanced directives List of other physicians Hospitalizations, surgeries, and ER visits in previous 12 months Vitals Screenings to include cognitive, depression, and falls Referrals and appointments  In addition, I have reviewed and discussed with patient certain preventive  protocols, quality metrics, and best practice recommendations. A written personalized care plan for preventive services as well as general preventive health recommendations were provided to patient.     Jordan Hawks Jeannifer Drakeford, CMA   08/08/2023   After Visit Summary: (MyChart) Due to this being a telephonic visit, the after visit summary with patients personalized plan was offered to patient via MyChart   Notes: Nothing significant to report at this time.

## 2023-08-21 NOTE — Progress Notes (Deleted)
 erroneous

## 2023-09-27 ENCOUNTER — Other Ambulatory Visit: Payer: Self-pay | Admitting: Family Medicine

## 2023-09-27 ENCOUNTER — Telehealth: Payer: Self-pay

## 2023-09-27 DIAGNOSIS — E1169 Type 2 diabetes mellitus with other specified complication: Secondary | ICD-10-CM

## 2023-09-27 MED ORDER — ROSUVASTATIN CALCIUM 5 MG PO TABS: 5.0000 mg | ORAL_TABLET | Freq: Every day | ORAL | 0 refills | Status: DC

## 2023-09-27 NOTE — Telephone Encounter (Signed)
 Copied from CRM [id number]. Topic: Clinical - Prescription Issue >> Sep 27, 2023  9:36 AM Donald Frost wrote: Reason for CRM: The patient called in stating for some reason she has not got her refills on her Empagliflozin-linaGLIPtin  (GLYXAMBI ) 25-5 MG TABS or her rosuvastatin  (CRESTOR ) 5 MG tablet. She talked with [facility] and they told her they do not have anything from the provider and the last request was denied. Please assist patient further as she needs her meds. She is trying to call [facility] back

## 2023-09-27 NOTE — Addendum Note (Signed)
 Addended by: RENTERIA-GARCIA, Lendell Gallick on: 09/27/2023 11:52 AM   Modules accepted: Orders

## 2023-09-27 NOTE — Telephone Encounter (Signed)
 Refilled crestor  but has enough refills of Empagliflozin-linaGLIPtin  (GLYXAMBI ) 25-5 MG TABS

## 2023-10-24 NOTE — Congregational Nurse Program (Signed)
  Dept: [phone]   Congregational Nurse Program Note  Date of Encounter: 10/24/2023  Past Medical History: Past Medical History:  Diagnosis Date   Diabetes mellitus without complication (HCC)    Hypertension     Encounter Details:  Community Questionnaire - 10/24/23 2032       Questionnaire   Ask client: Do you give verbal consent for me to treat you today? 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  Masen Luallen,MSN, RN

## 2023-11-01 NOTE — Progress Notes (Signed)
 Patient came to mobile screening at [facility]. Blood pressure within range. No Sdoh.

## 2023-11-05 ENCOUNTER — Ambulatory Visit (INDEPENDENT_AMBULATORY_CARE_PROVIDER_SITE_OTHER): Admitting: Family Medicine

## 2023-11-05 ENCOUNTER — Encounter: Payer: Self-pay | Admitting: Family Medicine

## 2023-11-05 VITALS — BP 122/78 | HR 88 | Resp 16 | Ht 60.0 in | Wt 201.6 lb

## 2023-11-05 DIAGNOSIS — Z780 Asymptomatic menopausal state: Secondary | ICD-10-CM | POA: Diagnosis not present

## 2023-11-05 DIAGNOSIS — E785 Hyperlipidemia, unspecified: Secondary | ICD-10-CM

## 2023-11-05 DIAGNOSIS — E669 Obesity, unspecified: Secondary | ICD-10-CM

## 2023-11-05 DIAGNOSIS — I10 Essential (primary) hypertension: Secondary | ICD-10-CM

## 2023-11-05 DIAGNOSIS — M150 Primary generalized (osteo)arthritis: Secondary | ICD-10-CM

## 2023-11-05 DIAGNOSIS — E1169 Type 2 diabetes mellitus with other specified complication: Secondary | ICD-10-CM

## 2023-11-05 DIAGNOSIS — M8580 Other specified disorders of bone density and structure, unspecified site: Secondary | ICD-10-CM | POA: Diagnosis not present

## 2023-11-05 DIAGNOSIS — B370 Candidal stomatitis: Secondary | ICD-10-CM | POA: Diagnosis not present

## 2023-11-05 DIAGNOSIS — I152 Hypertension secondary to endocrine disorders: Secondary | ICD-10-CM

## 2023-11-05 DIAGNOSIS — Z7984 Long term (current) use of oral hypoglycemic drugs: Secondary | ICD-10-CM

## 2023-11-05 DIAGNOSIS — E1159 Type 2 diabetes mellitus with other circulatory complications: Secondary | ICD-10-CM

## 2023-11-05 DIAGNOSIS — R21 Rash and other nonspecific skin eruption: Secondary | ICD-10-CM

## 2023-11-05 LAB — POCT GLYCOSYLATED HEMOGLOBIN (HGB A1C): Hemoglobin A1C: 7.2 % — AB (ref 4.0–5.6)

## 2023-11-05 MED ORDER — ROSUVASTATIN CALCIUM 5 MG PO TABS: 5.0000 mg | ORAL_TABLET | Freq: Every day | ORAL | 1 refills | Status: AC

## 2023-11-05 MED ORDER — LISINOPRIL 40 MG PO TABS
40.0000 mg | ORAL_TABLET | Freq: Every day | ORAL | 1 refills | Status: AC
Start: 2023-11-05 — End: ?

## 2023-11-05 MED ORDER — NYSTATIN 100000 UNIT/ML MT SUSP: 5.0000 mL | Freq: Four times a day (QID) | OROMUCOSAL | 0 refills | Status: DC

## 2023-11-05 MED ORDER — METFORMIN HCL ER 500 MG PO TB24: 500.0000 mg | ORAL_TABLET | Freq: Every day | ORAL | 1 refills | Status: DC

## 2023-11-05 NOTE — Progress Notes (Signed)
 Name: Elizabeth Blackwell   MRN: [id number]    DOB: [birth year]   Date:11/05/2023       Progress Note  Subjective  Chief Complaint  Chief Complaint  Patient presents with  . Medical Management of Chronic Issues   Discussed the use of AI scribe software for clinical note transcription with the patient, who gave verbal consent to proceed.  History of Present Illness Elizabeth Blackwell is a 73 year old female who presents for a follow-up visit.  She has developed fine, itchy bumps around her neck over the past couple of weeks. The itching is intermittent. No changes in soap or lotion have been made, and she has not tried any over-the-counter treatments for the rash.  Her diabetes is managed with Glyxambi  25/5 mg, with no side effects reported. Her HbA1c has improved from 7.9% to 7.2%. She previously tried metformin but experienced gastrointestinal side effects. She is not taking any other diabetes medications. No increased hunger, thirst, or frequent urination.  She is on lisinopril  for hypertension, with no side effects. Her blood pressure is well-controlled. She has lost six pounds since her last visit, now weighing 201 pounds, and is focused on maintaining a healthy diet, avoiding junk food and sugar. She mentions eating strawberries, blueberries, and occasionally keto popsicles.  She experiences recurrent oral thrush, managed with nystatin  as needed. The condition 'comes and goes' but is not currently causing discomfort. She has a history of yeast infections, managed with fluconazole  and nystatin .  She experiences chronic joint and back pain, describing it as 'always hurting'. She is frustrated with her weight and pain, noting that if she's not itching, her legs or back are hurting.    Patient Active Problem List   Diagnosis Date Noted  . Primary osteoarthritis involving multiple joints 07/12/2023  . Osteopenia after menopause 03/01/2023  . Obesity, diabetes, and hypertension syndrome  (HCC) 10/30/2022  . Family history of colon cancer requiring screening colonoscopy   . Chronic right-sided low back pain with right-sided sciatica 10/16/2021  . Other hyperlipidemia 10/16/2021  . Morbid obesity with BMI of 40.0-44.9, adult (HCC) 04/04/2016  . Essential hypertension 01/01/2007    Past Surgical History:  Procedure Laterality Date  . BACK SURGERY    . COLONOSCOPY WITH PROPOFOL     . COLONOSCOPY WITH PROPOFOL  N/A 12/11/2021   Procedure: COLONOSCOPY WITH PROPOFOL ;  Surgeon: Unk Corinn Skiff, MD;  Location: [facility] ENDOSCOPY;  Service: Gastroenterology;  Laterality: N/A;  . XI ROBOTIC LAPAROSCOPIC ASSISTED APPENDECTOMY N/A 07/26/2022   Procedure: XI ROBOTIC LAPAROSCOPIC ASSISTED APPENDECTOMY;  Surgeon: Desiderio Schanz, MD;  Location: [facility] ORS;  Service: General;  Laterality: N/A;    Family History  Problem Relation Age of Onset  . Dementia Mother   . Diabetes Sister     Social History   Tobacco Use  . Smoking status: Never  . Smokeless tobacco: Never  Substance Use Topics  . Alcohol use: Yes    Alcohol/week: 1.0 standard drink of alcohol    Types: 1 Cans of beer per week     Current Outpatient Medications:  .  Empagliflozin-linaGLIPtin  (GLYXAMBI ) 25-5 MG TABS, Take 1 tablet by mouth every morning. In place of Trajenta, Disp: 90 tablet, Rfl: 1 .  lisinopril  (ZESTRIL ) 40 MG tablet, Take 1 tablet (40 mg total) by mouth daily., Disp: 90 tablet, Rfl: 1 .  rosuvastatin  (CRESTOR ) 5 MG tablet, Take 1 tablet (5 mg total) by mouth daily., Disp: 90 tablet, Rfl: 0 .  fluconazole  (DIFLUCAN ) 100 MG tablet,  Take 200 mg the first day, followed by 100 mg daily for 7 days., Disp: 8 tablet, Rfl: 0 .  melatonin 5 MG TABS, Take 10 mg by mouth at bedtime., Disp: , Rfl:  .  nystatin  (MYCOSTATIN ) 100000 UNIT/ML suspension, Take 5 mLs (500,000 Units total) by mouth 4 (four) times daily., Disp: 60 mL, Rfl: 0  Allergies  Allergen Reactions  . Codeine Phosphate     REACTION: unspecified  .  Metformin And Related Diarrhea    I personally reviewed active problem list, medication list, allergies, family history with the patient/caregiver today.   ROS  Ten systems reviewed and is negative except as mentioned in HPI    Objective Physical Exam  CONSTITUTIONAL: Patient appears well-developed and well-nourished. No distress. HEENT: Head atraumatic, normocephalic, neck supple. CARDIOVASCULAR: Normal rate, regular rhythm and normal heart sounds. No murmur heard. No BLE edema. PULMONARY: Effort normal and breath sounds normal. Lungs clear to auscultation. No respiratory distress. ABDOMINAL: There is no tenderness or distention. MUSCULOSKELETAL: Normal gait. Without gross motor or sensory deficit. PSYCHIATRIC: Patient has a normal mood and affect. Behavior is normal. Judgment and thought content normal. SKIN: Fine bumps around neck, likely heat rash.  Vitals:   11/05/23 1016  BP: 122/78  Pulse: 88  Resp: 16  SpO2: 97%  Weight: 201 lb 9.6 oz (91.4 kg)  Height: 5' (1.524 m)    Body mass index is 39.37 kg/m.  Recent Results (from the past 2160 hours)  POCT glycosylated hemoglobin (Hb A1C)     Status: Abnormal   Collection Time: 11/05/23 10:21 AM  Result Value Ref Range   Hemoglobin A1C 7.2 (A) 4.0 - 5.6 %   HbA1c POC (<> result, manual entry)     HbA1c, POC (prediabetic range)     HbA1c, POC (controlled diabetic range)        PHQ2/9:    11/05/2023   10:11 AM 08/08/2023    2:01 PM 07/12/2023   10:57 AM 03/20/2023    1:20 PM 03/01/2023    9:49 AM  Depression screen PHQ 2/9  Decreased Interest 0 0 0 0 0  Down, Depressed, Hopeless 0 0 0 0 0  PHQ - 2 Score 0 0 0 0 0  Altered sleeping  0 0 0 0  Tired, decreased energy  0 0 0 0  Change in appetite  0 0 0 0  Feeling bad or failure about yourself   0 0 0 0  Trouble concentrating  0 0 0 0  Moving slowly or fidgety/restless  0 0 0 0  Suicidal thoughts  0 0 0 0  PHQ-9 Score  0 0 0 0  Difficult doing work/chores  Not  difficult at all Not difficult at all      phq 9 is negative  Fall Risk:    11/05/2023   10:11 AM 08/07/2023    3:17 PM 07/31/2023   10:46 AM 07/12/2023   10:50 AM 03/20/2023    1:20 PM  Fall Risk   Falls in the past year? 0 1 0 0 0  Number falls in past yr: 0 0 0 0   Injury with Fall? 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 Glyxambi  at maximum dose. Metformin XR discussed for better control and weight loss. Injectable medications declined. Pioglitazone not preferred due to weight gain risk. - Prescribe metformin XR, one tablet daily, lowest dose, for 90 days with refills. Monitor for gastrointestinal side effects. - Continue Glyxambi  25/5 mg. - Encourage dietary modifications to reduce sugar intake and promote weight loss.  Obesity Morbid due to BMI over 35 with co-morbidities such as DM, HTN BMI reduced to 37. Dietary changes and weight loss ongoing. Emphasized importance of continued weight loss. - Encourage continued healthy diet and weight loss efforts. - Monitor weight and BMI.  Dyslipidemia Lipid panel improved. Rosuvastatin  effective. Statin therapy importance in diabetes discussed. - Continue rosuvastatin .  Hypertension, controlled Blood pressure well controlled at 122/78 mmHg with current regimen. - Continue lisinopril . - Send lisinopril  prescription to pharmacy.  Recurrent oral candidiasis - Prescribe nystatin  solution for as-needed use.  Pruritic rash on neck Likely heat rash, localized, not severe. - Recommend over-the-counter  hydrocortisone cream. - Advise sunscreen use on face and neck.

## 2023-11-06 LAB — MICROALBUMIN / CREATININE URINE RATIO
Creatinine, Urine: 40 mg/dL (ref 20–275)
Microalb Creat Ratio: 15 mg/g{creat} (ref <30)
Microalb, Ur: 0.6 mg/dL

## 2023-11-07 ENCOUNTER — Ambulatory Visit: Payer: Self-pay | Admitting: Family Medicine

## 2023-12-12 ENCOUNTER — Other Ambulatory Visit: Payer: Self-pay | Admitting: Family Medicine

## 2023-12-12 DIAGNOSIS — E785 Hyperlipidemia, unspecified: Secondary | ICD-10-CM

## 2023-12-21 ENCOUNTER — Other Ambulatory Visit: Payer: Self-pay | Admitting: Family Medicine

## 2023-12-21 DIAGNOSIS — E1169 Type 2 diabetes mellitus with other specified complication: Secondary | ICD-10-CM

## 2023-12-21 DIAGNOSIS — I152 Hypertension secondary to endocrine disorders: Secondary | ICD-10-CM

## 2023-12-21 DIAGNOSIS — I10 Essential (primary) hypertension: Secondary | ICD-10-CM

## 2023-12-24 ENCOUNTER — Telehealth: Payer: Self-pay | Admitting: Family Medicine

## 2023-12-24 ENCOUNTER — Other Ambulatory Visit: Payer: Self-pay | Admitting: Family Medicine

## 2023-12-24 DIAGNOSIS — I10 Essential (primary) hypertension: Secondary | ICD-10-CM

## 2023-12-24 DIAGNOSIS — E1169 Type 2 diabetes mellitus with other specified complication: Secondary | ICD-10-CM

## 2023-12-24 DIAGNOSIS — E669 Obesity, unspecified: Secondary | ICD-10-CM

## 2023-12-24 NOTE — Telephone Encounter (Signed)
 rosuvastatin  (CRESTOR ) 5 MG tablet   Last refill was from [facility] but this one is from [facility]

## 2023-12-24 NOTE — Telephone Encounter (Signed)
 Previous rx was written for 6 month too early to fill.

## 2023-12-26 NOTE — Progress Notes (Addendum)
 Attempting to close encounter - MWV done and documented on alternate encounter same day

## 2023-12-26 NOTE — Progress Notes (Deleted)
 Note copied so that I could assign a co-signer so that visit could be closed out   Attempting to close encounter - MWV done and documented on alternate encounter same day

## 2024-01-01 NOTE — Telephone Encounter (Signed)
 2nd request-please send to [facility]

## 2024-01-02 LAB — GLUCOSE, POCT (MANUAL RESULT ENTRY): POC Glucose: 145 mg/dL — AB (ref 70–99)

## 2024-01-02 NOTE — Congregational Nurse Program (Signed)
  Dept: [phone]   Congregational Nurse Program Note  Date of Encounter: 01/02/2024  Past Medical History: Past Medical History:  Diagnosis Date   Diabetes mellitus without complication (HCC)    Hypertension     Encounter Details:  Community Questionnaire - 01/02/24 1713       Questionnaire   Ask client: Do you give verbal consent for me to treat you today? Yes    Student Assistance N/A    Location Patient Served  [place]    Encounter Setting CN site    Population Status Unknown    Insurance Medicare;Medicaid    Insurance/Financial Assistance Referral N/A    Medication N/A    Medical Provider Yes    Screening Referrals Made N/A    Medical Referrals Made N/A    Medical Appointment Completed N/A    CNP Interventions Advocate/Support    Screenings CN Performed Blood Pressure;Blood Glucose    ED Visit Averted N/A    Life-Saving Intervention Made N/A          There were no vitals filed for this visit. There is no height or weight on file to calculate BMI.

## 2024-01-16 NOTE — Congregational Nurse Program (Signed)
  Dept: [phone]   Congregational Nurse Program Note  Date of Encounter: 01/16/2024  Patient complains of heart racing and feeling flutters especially when exercising.  Patient encouraged to speak with PCP about possible underlying issue that maybe causing this.  Patient says she does not drink much water during the day, encouraged to stay hydrated and educated on importance of staying hydrated. Past Medical History: Past Medical History:  Diagnosis Date   Diabetes mellitus without complication (HCC)    Hypertension     Encounter Details:   Today's Vitals   01/16/24 1630  BP: 133/83  Pulse: 97  SpO2: 98%   There is no height or weight on file to calculate BMI.

## 2024-02-17 ENCOUNTER — Other Ambulatory Visit: Payer: Self-pay | Admitting: Family Medicine

## 2024-02-17 DIAGNOSIS — Z1231 Encounter for screening mammogram for malignant neoplasm of breast: Secondary | ICD-10-CM

## 2024-03-20 ENCOUNTER — Ambulatory Visit
Admission: RE | Admit: 2024-03-20 | Discharge: 2024-03-20 | Disposition: A | Discharge status: Home / Self Care | Source: Ambulatory Visit | Attending: Family Medicine | Admitting: Family Medicine

## 2024-03-20 DIAGNOSIS — Z1231 Encounter for screening mammogram for malignant neoplasm of breast: Secondary | ICD-10-CM | POA: Insufficient documentation

## 2024-03-25 ENCOUNTER — Ambulatory Visit: Attending: Family Medicine

## 2024-03-25 ENCOUNTER — Encounter: Payer: Self-pay | Admitting: Family Medicine

## 2024-03-25 ENCOUNTER — Ambulatory Visit: Admitting: Family Medicine

## 2024-03-25 VITALS — BP 116/74 | HR 99 | Resp 16 | Ht 60.0 in | Wt 197.0 lb

## 2024-03-25 DIAGNOSIS — I10 Essential (primary) hypertension: Secondary | ICD-10-CM | POA: Diagnosis not present

## 2024-03-25 DIAGNOSIS — R002 Palpitations: Secondary | ICD-10-CM

## 2024-03-25 DIAGNOSIS — M5441 Lumbago with sciatica, right side: Secondary | ICD-10-CM

## 2024-03-25 DIAGNOSIS — M5442 Lumbago with sciatica, left side: Secondary | ICD-10-CM

## 2024-03-25 DIAGNOSIS — E785 Hyperlipidemia, unspecified: Secondary | ICD-10-CM

## 2024-03-25 DIAGNOSIS — Z87898 Personal history of other specified conditions: Secondary | ICD-10-CM

## 2024-03-25 DIAGNOSIS — G43909 Migraine, unspecified, not intractable, without status migrainosus: Secondary | ICD-10-CM | POA: Diagnosis not present

## 2024-03-25 DIAGNOSIS — Z23 Encounter for immunization: Secondary | ICD-10-CM

## 2024-03-25 DIAGNOSIS — E669 Obesity, unspecified: Secondary | ICD-10-CM

## 2024-03-25 DIAGNOSIS — E119 Type 2 diabetes mellitus without complications: Secondary | ICD-10-CM

## 2024-03-25 DIAGNOSIS — M8580 Other specified disorders of bone density and structure, unspecified site: Secondary | ICD-10-CM

## 2024-03-25 DIAGNOSIS — E1159 Type 2 diabetes mellitus with other circulatory complications: Secondary | ICD-10-CM | POA: Diagnosis not present

## 2024-03-25 DIAGNOSIS — G8929 Other chronic pain: Secondary | ICD-10-CM

## 2024-03-25 DIAGNOSIS — E1169 Type 2 diabetes mellitus with other specified complication: Secondary | ICD-10-CM

## 2024-03-25 LAB — POCT GLYCOSYLATED HEMOGLOBIN (HGB A1C): Hemoglobin A1C: 7 % — AB (ref 4.0–5.6)

## 2024-03-25 MED ORDER — GLYXAMBI 25-5 MG PO TABS: 1.0000 | ORAL_TABLET | ORAL | 1 refills | Status: AC

## 2024-03-25 MED ORDER — RIZATRIPTAN BENZOATE 10 MG PO TBDP: 10.0000 mg | ORAL_TABLET | ORAL | 0 refills | Status: AC | PRN

## 2024-03-25 MED ORDER — SCOPOLAMINE 1 MG/3DAYS TD PT72: 1.0000 | MEDICATED_PATCH | TRANSDERMAL | 0 refills | Status: DC

## 2024-03-25 MED ORDER — PREGABALIN 25 MG PO CAPS: 25.0000 mg | ORAL_CAPSULE | Freq: Every evening | ORAL | 0 refills | Status: DC

## 2024-03-25 NOTE — Progress Notes (Signed)
 Name: Elizabeth Blackwell   MRN: [id number]    DOB: [birth year]   Date:03/25/2024       Progress Note  Subjective  Chief Complaint  Chief Complaint  Patient presents with   Medical Management of Chronic Issues   Discussed the use of AI scribe software for clinical note transcription with the patient, who gave verbal consent to proceed.  History of Present Illness Elizabeth Blackwell is a 73 year old female with diabetes and hypertension who presents for a follow-up visit.  She has a history of diabetes with significant improvement in her A1c from 13.6 in 2023 to 7.0 currently, attributed to increased physical activity, including attending classes at the [facility] four times a week, such as 'women on weights' and Zumba. Her current medications include Glyxambi  25/5 mg, lisinopril  for HTN  and rosuvastatin  5 mg for dyslipidemia . She previously discontinued metformin  due to diarrhea.  She experienced a recent episode of a severe headache, described as a sharp, constant pain on the top of her head, accompanied by nausea, photophobia, phonophobia, and visual disturbances described as 'seeing black spots'. She has a history of migraines but has not experienced them in years. Tylenol  did not provide relief.  She experiences occasional palpitations and shortness of breath, particularly when walking to her car, but not during exercise at the gym. No dizziness or chest pain associated with her blood pressure medication, lisinopril .  She reports chronic back pain with radiation down her leg to her ankle. She uses ice and heat for relief and has not taken gabapentin  or Lyrica  before.  Her social history includes a planned cruise to [place] with her daughter and sister, which was a birthday gift from her daughter. She is actively engaged in physical activities and is motivated by the investment in her health.    Patient Active Problem List   Diagnosis Date Noted   Primary osteoarthritis involving multiple  joints 07/12/2023   Osteopenia after menopause 03/01/2023   Obesity, diabetes, and hypertension syndrome (HCC) 10/30/2022   Family history of colon cancer requiring screening colonoscopy    Chronic right-sided low back pain with right-sided sciatica 10/16/2021   Other hyperlipidemia 10/16/2021   Morbid obesity with BMI of 40.0-44.9, adult (HCC) 04/04/2016   Essential hypertension 01/01/2007    Past Surgical History:  Procedure Laterality Date   BACK SURGERY     COLONOSCOPY WITH PROPOFOL      COLONOSCOPY WITH PROPOFOL  N/A 12/11/2021   Procedure: COLONOSCOPY WITH PROPOFOL ;  Surgeon: Unk Corinn Skiff, MD;  Location: [facility];  Service: Gastroenterology;  Laterality: N/A;   XI ROBOTIC LAPAROSCOPIC ASSISTED APPENDECTOMY N/A 07/26/2022   Procedure: XI ROBOTIC LAPAROSCOPIC ASSISTED APPENDECTOMY;  Surgeon: Desiderio Schanz, MD;  Location: [facility] ORS;  Service: General;  Laterality: N/A;    Family History  Problem Relation Age of Onset   Dementia Mother    Diabetes Sister     Social History   Tobacco Use   Smoking status: Never   Smokeless tobacco: Never  Substance Use Topics   Alcohol use: Yes    Alcohol/week: 1.0 standard drink of alcohol    Types: 1 Cans of beer per week     Current Outpatient Medications:    Empagliflozin-linaGLIPtin  (GLYXAMBI ) 25-5 MG TABS, Take 1 tablet by mouth every morning. In place of Trajenta, Disp: 90 tablet, Rfl: 1   lisinopril  (ZESTRIL ) 40 MG tablet, Take 1 tablet (40 mg total) by mouth daily., Disp: 90 tablet, Rfl: 1   metFORMIN  (GLUCOPHAGE -XR) 500 MG  24 hr tablet, Take 1 tablet (500 mg total) by mouth daily with breakfast., Disp: 90 tablet, Rfl: 1   nystatin  (MYCOSTATIN ) 100000 UNIT/ML suspension, Take 5 mLs (500,000 Units total) by mouth 4 (four) times daily., Disp: 60 mL, Rfl: 0   rosuvastatin  (CRESTOR ) 5 MG tablet, Take 1 tablet (5 mg total) by mouth daily., Disp: 90 tablet, Rfl: 1   melatonin 5 MG TABS, Take 10 mg by mouth at bedtime., Disp: ,  Rfl:   Allergies  Allergen Reactions   Codeine Phosphate     REACTION: unspecified   Metformin  And Related Diarrhea    I personally reviewed active problem list, medication list, allergies, family history with the patient/caregiver today.   ROS  Ten systems reviewed and is negative except as mentioned in HPI    Objective Physical Exam  CONSTITUTIONAL: Patient appears well-developed and well-nourished. No distress. HEENT: Head atraumatic, normocephalic, neck supple. CARDIOVASCULAR: Normal rate, regular rhythm and normal heart sounds. No murmur heard. No BLE edema. Extremities normal. Foot normal with conformation on big toe. PULMONARY: Effort normal and breath sounds normal. No respiratory distress. ABDOMINAL: There is no tenderness or distention. MUSCULOSKELETAL: Normal gait. Without gross motor or sensory deficit. PSYCHIATRIC: Patient has a normal mood and affect. Behavior is normal. Judgment and thought content normal.  Vitals:   03/25/24 1317  BP: 116/74  Pulse: 99  Resp: 16  SpO2: 96%  Weight: 197 lb (89.4 kg)  Height: 5' (1.524 m)    Body mass index is 38.47 kg/m.  Recent Results (from the past 2160 hours)  POCT glucose (manual entry)     Status: Abnormal   Collection Time: 01/02/24  4:00 PM  Result Value Ref Range   POC Glucose 145 (A) 70 - 99 mg/dl  POCT glycosylated hemoglobin (Hb A1C)     Status: Abnormal   Collection Time: 03/25/24  1:24 PM  Result Value Ref Range   Hemoglobin A1C 7.0 (A) 4.0 - 5.6 %   HbA1c POC (<> result, manual entry)     HbA1c, POC (prediabetic range)     HbA1c, POC (controlled diabetic range)      Diabetic Foot Exam:  Diabetic foot exam was performed with the following findings:   No deformities, ulcerations, or other skin breakdown Normal sensation of 10g monofilament Intact posterior tibialis and dorsalis pedis pulses      PHQ2/9:    03/25/2024    1:06 PM 11/05/2023   10:11 AM 08/08/2023    2:01 PM 07/12/2023   10:57  AM 03/20/2023    1:20 PM  Depression screen PHQ 2/9  Decreased Interest 0 0 0 0 0  Down, Depressed, Hopeless 0 0 0 0 0  PHQ - 2 Score 0 0 0 0 0  Altered sleeping   0 0 0  Tired, decreased energy   0 0 0  Change in appetite   0 0 0  Feeling bad or failure about yourself    0 0 0  Trouble concentrating   0 0 0  Moving slowly or fidgety/restless   0 0 0  Suicidal thoughts   0 0 0  PHQ-9 Score   0  0  0   Difficult doing work/chores   Not difficult at all Not difficult at all      Data saved with a previous flowsheet row definition    phq 9 is negative  Fall Risk:    03/25/2024    1:06 PM 11/05/2023   10:11 AM 08/07/2023  3:17 PM 07/31/2023   10:46 AM 07/12/2023   10:50 AM  Fall Risk   Falls in the past year? 0 0 1 0 0  Number falls in past yr: 0 0 0 0 0  Injury with Fall? 0 0 0 0 0  Risk for fall due to : No Fall Risks No Fall Risks No Fall Risks No Fall Risks No Fall Risks  Follow up Falls evaluation completed Falls prevention discussed;Education provided;Falls evaluation completed Falls prevention discussed;Education provided Falls evaluation completed Falls prevention discussed;Education provided;Falls evaluation completed     Assessment & Plan Type 2 diabetes mellitus associated with HTN  Well-controlled with A1c of 7.0, improved from 7.2 in February and 13.6 in 2023. Regular physical activity contributes to improved glycemic control. Not taking metformin  due to diarrhea, current regimen includes glipizide and Glyxambi . - Continue current diabetes management regimen with glipizide and Glyxambi . - Encouraged continuation of regular physical activity.  Essential hypertension Well-controlled with lisinopril . Blood pressure is 116/74 mmHg. No dizziness or lightheadedness, indicating good tolerance to medication. Physical activity may contribute to lower blood pressure. - Continue lisinopril  as prescribed. - Monitor for symptoms of dizziness or lightheadedness, especially  during physical activity. - If symptoms occur, consider reducing lisinopril  dose and reassess blood pressure.  Morbid Obesity BMI over 35 with comorbidities including diabetes and hypertension. Weight loss beneficial for overall health and management of these conditions. Not interested in weight loss medications. - Encouraged continued lifestyle modifications, including diet and exercise, to promote weight loss.  Migraine Recent episode with sharp, constant pain, nausea, vomiting, and light sensitivity. No prior history of similar migraines. Concerned about potential episodes during upcoming cruise. - Prescribed Imitrex and Maxalt for acute migraine management. - Advised taking ibuprofen  for additional pain relief during the cruise. - Discussed potential side effects of Maxalt, including flushing and increased heart rate.  Lumbago with left-sided sciatica Chronic back pain with radicular symptoms down the left leg, suggestive of sciatica. Likely due to nerve involvement. Uses ice and heat for symptom relief. - Prescribed Lyrica , starting at 25 mg at night, with gradual increase to 100 mg as tolerated. - Advised monitoring for side effects, particularly drowsiness, and adjusting timing of dose accordingly.  Osteopenia Bone density scan showed fracture risk below cutoff. - Recommended high calcium  diet and vitamin D supplementation.  Palpitations Intermittent palpitations with occasional shortness of breath. No symptoms during exercise. Differential includes arrhythmia or supraventricular tachycardia. - Ordered Zio patch for 14-day monitoring of heart rhythm. - Advised monitoring for symptoms and reporting any significant changes.

## 2024-03-27 ENCOUNTER — Other Ambulatory Visit: Payer: Self-pay | Admitting: Family Medicine

## 2024-03-27 DIAGNOSIS — Z87898 Personal history of other specified conditions: Secondary | ICD-10-CM

## 2024-03-30 ENCOUNTER — Other Ambulatory Visit (HOSPITAL_COMMUNITY): Payer: Self-pay

## 2024-03-30 ENCOUNTER — Telehealth: Payer: Self-pay | Admitting: Pharmacy Technician

## 2024-03-30 NOTE — Telephone Encounter (Signed)
 Pharmacy Patient Advocate Encounter  Received notification from [employer] that Prior Authorization for Pregabalin  25MG  capsules has been APPROVED from 05/15/23 to 05/13/25. Ran test claim, Copay is $0.00. This test claim was processed through [facility]- copay amounts may vary at other pharmacies due to pharmacy/plan contracts, or as the patient moves through the different stages of their insurance plan.   PA #/Case ID/Reference #: [id number]

## 2024-03-30 NOTE — Telephone Encounter (Signed)
 Pharmacy Patient Advocate Encounter   Received notification from Onbase that prior authorization for Pregabalin  25MG  capsules is required/requested.   Insurance verification completed.   The patient is insured through [employer].   Per test claim: PA required; PA started via CoverMyMeds. KEY [id number] . Waiting for clinical questions to populate.

## 2024-03-30 NOTE — Telephone Encounter (Signed)
 Duplicate request, too soon for refill.  Requested Prescriptions  Pending Prescriptions Disp Refills   scopolamine (TRANSDERM-SCOP) 1 MG/3DAYS [Pharmacy Med Name: Scopolamine 1 MG/3DAYS Transdermal Patch 72 Hour] 4 patch 0    Sig: APPLY 1 PATCH TOPICALLY EVERY 72 HOURS     Off-Protocol Failed - 03/30/2024  2:34 PM      Failed - Medication not assigned to a protocol, review manually.      Passed - Valid encounter within last 12 months    Recent Outpatient Visits           5 days ago Dyslipidemia associated with type 2 diabetes mellitus [facility])   [place] [facility] [place], Dorette, MD   4 months ago Dyslipidemia associated with type 2 diabetes mellitus [facility])   [place] [facility] Sowles, Krichna, MD   8 months ago Oral candidiasis   [facility] [place] Bernardo Fend, DO   8 months ago Morbid obesity [facility])   [place] [facility] Sowles, Krichna, MD

## 2024-04-07 DIAGNOSIS — H35033 Hypertensive retinopathy, bilateral: Secondary | ICD-10-CM | POA: Diagnosis not present

## 2024-04-07 DIAGNOSIS — H2513 Age-related nuclear cataract, bilateral: Secondary | ICD-10-CM | POA: Diagnosis not present

## 2024-04-07 LAB — OPHTHALMOLOGY REPORT-SCANNED

## 2024-04-23 ENCOUNTER — Ambulatory Visit: Admitting: Family Medicine

## 2024-04-29 ENCOUNTER — Encounter: Payer: Self-pay | Admitting: Family Medicine

## 2024-04-29 ENCOUNTER — Ambulatory Visit: Admitting: Family Medicine

## 2024-04-29 VITALS — BP 126/76 | HR 85 | Resp 16 | Ht 60.0 in | Wt 205.1 lb

## 2024-04-29 DIAGNOSIS — K0889 Other specified disorders of teeth and supporting structures: Secondary | ICD-10-CM | POA: Diagnosis not present

## 2024-04-29 DIAGNOSIS — Z79899 Other long term (current) drug therapy: Secondary | ICD-10-CM

## 2024-04-29 DIAGNOSIS — I480 Paroxysmal atrial fibrillation: Secondary | ICD-10-CM | POA: Insufficient documentation

## 2024-04-29 DIAGNOSIS — M5441 Lumbago with sciatica, right side: Secondary | ICD-10-CM | POA: Diagnosis not present

## 2024-04-29 DIAGNOSIS — G8929 Other chronic pain: Secondary | ICD-10-CM

## 2024-04-29 DIAGNOSIS — M5442 Lumbago with sciatica, left side: Secondary | ICD-10-CM | POA: Diagnosis not present

## 2024-04-29 DIAGNOSIS — I471 Supraventricular tachycardia, unspecified: Secondary | ICD-10-CM | POA: Insufficient documentation

## 2024-04-29 LAB — COMPREHENSIVE METABOLIC PANEL WITH GFR
AG Ratio: 1.6 (calc) (ref 1.0–2.5)
ALT: 18 U/L (ref 6–29)
AST: 17 U/L (ref 10–35)
Albumin: 4.2 g/dL (ref 3.6–5.1)
Alkaline phosphatase (APISO): 85 U/L (ref 37–153)
BUN: 18 mg/dL (ref 7–25)
CO2: 28 mmol/L (ref 20–32)
Calcium: 9.4 mg/dL (ref 8.6–10.4)
Chloride: 102 mmol/L (ref 98–110)
Creat: 0.72 mg/dL (ref 0.60–1.00)
Globulin: 2.7 g/dL (ref 1.9–3.7)
Glucose, Bld: 131 mg/dL — ABNORMAL HIGH (ref 65–99)
Potassium: 4.5 mmol/L (ref 3.5–5.3)
Sodium: 138 mmol/L (ref 135–146)
Total Bilirubin: 0.3 mg/dL (ref 0.2–1.2)
Total Protein: 6.9 g/dL (ref 6.1–8.1)
eGFR: 89 mL/min/1.73m2 (ref >=60)

## 2024-04-29 MED ORDER — NAPROXEN 500 MG PO TABS: 500.0000 mg | ORAL_TABLET | Freq: Two times a day (BID) | ORAL | 0 refills | Status: AC

## 2024-04-29 MED ORDER — PREGABALIN 300 MG PO CAPS: 300.0000 mg | ORAL_CAPSULE | Freq: Every evening | ORAL | 1 refills | Status: AC

## 2024-04-29 MED ORDER — TRAMADOL HCL 50 MG PO TABS: 50.0000 mg | ORAL_TABLET | Freq: Three times a day (TID) | ORAL | 0 refills | Status: AC | PRN

## 2024-04-29 NOTE — Progress Notes (Signed)
 Name: Elizabeth Blackwell   MRN: [id number]    DOB: 07/06/1950   Date:04/29/2024       Progress Note  Subjective  Chief Complaint  Chief Complaint  Patient presents with   Medical Management of Chronic Issues   Discussed the use of AI scribe software for clinical note transcription with the patient, who gave verbal consent to proceed.  History of Present Illness Elizabeth Blackwell is a 73 year old female with diabetes and hypertension who presents for a regular follow-up visit.  She recently experienced significant dental pain following a tooth extraction and development of a dry socket. The pain was severe, rated as ten out of ten, and persisted from Wednesday to Sunday. She has been taking excessive amounts of Tylenol  and Advil  to manage the pain and is scheduled to return to the dentist for further treatment.  She has a history of low back pain with radiculopathy, characterized by shooting pain down her leg. She was initially prescribed Lyrica , starting at 25 mg at night, with a gradual increase to 100 mg. While the medication initially provided relief, it did not significantly alleviate the leg pain. She has been taking up to four capsules at night.  She recently wore a Zio patch due to palpitations and reports episodes of her heart racing, particularly when lying down. She documented these episodes in a booklet provided. She was informed that her heart rate reached up to 235 bpm during these episodes. No shortness of breath or chest pain during these episodes.  CHF History: 0 HTN History: 1 Diabetes History: 1 Stroke History: 0 Vascular Disease History: 0 Age Score: 1 Gender Score: 1  She recently went on a cruise and experienced motion sickness due to not receiving her prescription in time. She managed the symptoms with Dramamine.  Her current medications include Lyrica  for neuropathic pain, Tylenol , and Advil  for dental pain, and tramadol  for pain management.    Click Here to  Calculate/Change CHADS2VASc Score     Patient Active Problem List   Diagnosis Date Noted   Primary osteoarthritis involving multiple joints 07/12/2023   Osteopenia after menopause 03/01/2023   Obesity, diabetes, and hypertension syndrome (HCC) 10/30/2022   Family history of colon cancer requiring screening colonoscopy    Chronic right-sided low back pain with right-sided sciatica 10/16/2021   Other hyperlipidemia 10/16/2021   Morbid obesity with BMI of 40.0-44.9, adult (HCC) 04/04/2016   Essential hypertension 01/01/2007    Past Surgical History:  Procedure Laterality Date   BACK SURGERY     COLONOSCOPY WITH PROPOFOL      COLONOSCOPY WITH PROPOFOL  N/A 12/11/2021   Procedure: COLONOSCOPY WITH PROPOFOL ;  Surgeon: Unk Corinn Skiff, MD;  Location: [facility] ENDOSCOPY;  Service: Gastroenterology;  Laterality: N/A;   XI ROBOTIC LAPAROSCOPIC ASSISTED APPENDECTOMY N/A 07/26/2022   Procedure: XI ROBOTIC LAPAROSCOPIC ASSISTED APPENDECTOMY;  Surgeon: Desiderio Schanz, MD;  Location: [facility] ORS;  Service: General;  Laterality: N/A;    Family History  Problem Relation Age of Onset   Dementia Mother    Diabetes Sister     Social History   Tobacco Use   Smoking status: Never   Smokeless tobacco: Never  Substance Use Topics   Alcohol use: Yes    Alcohol/week: 1.0 standard drink of alcohol    Types: 1 Cans of beer per week    Current Medications[1]  Allergies[2]  I personally reviewed active problem list, medication list, allergies with the patient/caregiver today.   ROS  Ten systems reviewed and  is negative except as mentioned in HPI    Objective Physical Exam CONSTITUTIONAL: Patient appears well-developed and well-nourished. No distress. HEENT: Head atraumatic, normocephalic, neck supple. Inflamed gums. Missing teeth  CARDIOVASCULAR: Normal rate, regular rhythm and normal heart sounds. No murmur heard. No BLE edema. PULMONARY: Effort normal and breath sounds normal. No respiratory  distress. ABDOMINAL: There is no tenderness or distention. MUSCULOSKELETAL: Normal gait. Without gross motor or sensory deficit. PSYCHIATRIC: Patient has a normal mood and affect. Behavior is normal. Judgment and thought content normal.  Vitals:   04/29/24 1052  BP: 126/76  Pulse: 85  Resp: 16  SpO2: 97%  Weight: 205 lb 1.6 oz (93 kg)  Height: 5' (1.524 m)    Body mass index is 40.06 kg/m.  Recent Results (from the past 2160 hours)  POCT glycosylated hemoglobin (Hb A1C)     Status: Abnormal   Collection Time: 03/25/24  1:24 PM  Result Value Ref Range   Hemoglobin A1C 7.0 (A) 4.0 - 5.6 %   HbA1c POC (<> result, manual entry)     HbA1c, POC (prediabetic range)     HbA1c, POC (controlled diabetic range)    OPHTHALMOLOGY REPORT-SCANNED     Status: None   Collection Time: 04/07/24  5:09 PM  Result Value Ref Range   HM Diabetic Eye Exam No Retinopathy No Retinopathy    Comment: Abstracted by HIM   A Comment       PHQ2/9:    04/29/2024   10:49 AM 03/25/2024    1:06 PM 11/05/2023   10:11 AM 08/08/2023    2:01 PM 07/12/2023   10:57 AM  Depression screen PHQ 2/9  Decreased Interest 0 0 0 0 0  Down, Depressed, Hopeless 0 0 0 0 0  PHQ - 2 Score 0 0 0 0 0  Altered sleeping    0 0  Tired, decreased energy    0 0  Change in appetite    0 0  Feeling bad or failure about yourself     0 0  Trouble concentrating    0 0  Moving slowly or fidgety/restless    0 0  Suicidal thoughts    0 0  PHQ-9 Score    0  0   Difficult doing work/chores    Not difficult at all Not difficult at all     Data saved with a previous flowsheet row definition    phq 9 is negative  Fall Risk:    04/29/2024   10:49 AM 03/25/2024    1:06 PM 11/05/2023   10:11 AM 08/07/2023    3:17 PM 07/31/2023   10:46 AM  Fall Risk   Falls in the past year? 0 0 0 1  0  Number falls in past yr: 0 0 0 0  0  Injury with Fall? 0 0  0  0   0   Risk for fall due to : No Fall Risks No Fall Risks No Fall Risks No Fall  Risks No Fall Risks  Follow up Falls evaluation completed Falls evaluation completed Falls prevention discussed;Education provided;Falls evaluation completed Falls prevention discussed;Education provided Falls evaluation completed     Manually entered by patient   Data saved with a previous flowsheet row definition     Assessment & Plan Post-extraction dental pain with dry socket Severe pain post-dental procedure with dry socket. Current analgesic use unsafe. - Prescribed tramadol  21 tablets for 7 days, up to three times daily. - Prescribed naproxen  twice  daily for inflammation. - Ordered liver and kidney function tests. - Advised to stop Tylenol  and Advil  immediately. - Referred to dentist for dry socket management.  Chronic low back pain with bilateral sciatica Chronic low back pain with sciatica. Lyrica  partially effective. - Increased Lyrica  to 300 mg at night with gradual titration. - Provided directions for neuropathic pain management.  Paroxysmal atrial fibrillation and supraventricular tachycardia High stroke risk with CHAD-VASc score of 4. Anticoagulation deferred due to dental procedures. - Referred to cardiologist for further evaluation. - Advised to avoid anticoagulation until after dental treatment and cardiologist consultation.        [1]  Current Outpatient Medications:    Empagliflozin-linaGLIPtin  (GLYXAMBI ) 25-5 MG TABS, Take 1 tablet by mouth every morning. In place of Trajenta, Disp: 90 tablet, Rfl: 1   lisinopril  (ZESTRIL ) 40 MG tablet, Take 1 tablet (40 mg total) by mouth daily., Disp: 90 tablet, Rfl: 1   naproxen  (NAPROSYN ) 500 MG tablet, Take 1 tablet (500 mg total) by mouth 2 (two) times daily with a meal. For toothache, plus tylenol  500 mg only three times daily, Disp: 60 tablet, Rfl: 0   rizatriptan  (MAXALT -MLT) 10 MG disintegrating tablet, Take 1 tablet (10 mg total) by mouth as needed for migraine. May repeat in 2 hours if needed, Disp: 10 tablet, Rfl:  0   rosuvastatin  (CRESTOR ) 5 MG tablet, Take 1 tablet (5 mg total) by mouth daily., Disp: 90 tablet, Rfl: 1   traMADol  (ULTRAM ) 50 MG tablet, Take 1 tablet (50 mg total) by mouth every 8 (eight) hours as needed for up to 7 days., Disp: 21 tablet, Rfl: 0   pregabalin  (LYRICA ) 300 MG capsule, Take 1 capsule (300 mg total) by mouth at bedtime. Back pain that shoots down right leg, Disp: 90 capsule, Rfl: 1 [2]  Allergies Allergen Reactions   Codeine Phosphate     REACTION: unspecified   Metformin  And Related Diarrhea

## 2024-04-30 ENCOUNTER — Ambulatory Visit: Payer: Self-pay | Admitting: Family Medicine

## 2024-05-03 ENCOUNTER — Other Ambulatory Visit: Payer: Self-pay | Admitting: Cardiology

## 2024-05-03 DIAGNOSIS — I480 Paroxysmal atrial fibrillation: Secondary | ICD-10-CM

## 2024-05-03 DIAGNOSIS — R002 Palpitations: Secondary | ICD-10-CM

## 2024-05-04 ENCOUNTER — Encounter (HOSPITAL_COMMUNITY): Payer: Self-pay

## 2024-05-08 ENCOUNTER — Ambulatory Visit: Admitting: Cardiology

## 2024-05-15 ENCOUNTER — Ambulatory Visit (INDEPENDENT_AMBULATORY_CARE_PROVIDER_SITE_OTHER): Admitting: Family Medicine

## 2024-05-15 ENCOUNTER — Encounter: Payer: Self-pay | Admitting: Family Medicine

## 2024-05-15 VITALS — BP 126/74 | HR 92 | Resp 16 | Ht 60.0 in | Wt 205.0 lb

## 2024-05-15 DIAGNOSIS — M542 Cervicalgia: Secondary | ICD-10-CM | POA: Diagnosis not present

## 2024-05-15 MED ORDER — TIZANIDINE HCL 4 MG PO TABS: 4.0000 mg | ORAL_TABLET | Freq: Two times a day (BID) | ORAL | 0 refills | Status: AC | PRN

## 2024-05-15 MED ORDER — METHYLPREDNISOLONE 4 MG PO TBPK: ORAL_TABLET | ORAL | 0 refills | Status: AC

## 2024-05-15 NOTE — Progress Notes (Signed)
 "  Acute Office Visit  Subjective:     Patient ID: LIELLE VANDERVORT, female    DOB: 1950-06-13, 74 y.o.   MRN: [id number]  Chief Complaint  Patient presents with   Neck Pain    X3 days. Radiates into shoulders/head.     Patient is in today for complaints of neck pain. She is a new patient to me. She voices neck pain radiates to both shoulders. She voices neck pain started on Monday. She voices due to the pain she has been sleeping in the bed. She does endorse associated headaches. She feels that her balance has been off. She has pain with rotation of the cervical spine and pain with flexion and extension of the neck. She has full active range of motion of bilateral upper extremities.   She states she has taken Tylenol  for relief of symptoms and does not see improvement in symptoms. She voices she had been taking Pregabalin  for management of back pain and sciatica but has not taken this since Monday.   Review of Systems  Constitutional:  Negative for fever.  Musculoskeletal:  Positive for neck pain. Negative for falls.  Neurological:  Positive for dizziness and headaches.        Objective:    BP 126/74   Pulse 92   Resp 16   Ht 5' (1.524 m)   Wt 205 lb (93 kg)   SpO2 99%   BMI 40.04 kg/m    Physical Exam Constitutional:      Appearance: Normal appearance.  Eyes:     Conjunctiva/sclera: Conjunctivae normal.     Pupils: Pupils are equal, round, and reactive to light.  Neck:     Vascular: No carotid bruit.  Cardiovascular:     Rate and Rhythm: Normal rate and regular rhythm.     Heart sounds: Normal heart sounds.  Pulmonary:     Effort: Pulmonary effort is normal.     Breath sounds: Normal breath sounds.  Musculoskeletal:     Cervical back: No swelling, tenderness or bony tenderness. Pain with movement present. Decreased range of motion.  Skin:    General: Skin is warm and dry.  Neurological:     General: No focal deficit present.     Mental Status: She is alert.   Psychiatric:        Mood and Affect: Mood normal.        Behavior: Behavior normal.        Assessment & Plan:   Assessment & Plan Neck pain Patient is a 74 year old female seen in office today due to neck pain. She states neck pain started on Monday morning. She voices she woke up Monday morning with neck pain. She denies falls or other injuries prior to onset of symptoms. She has decreased ROM of cervical spine s/t discomfort. She denies pain with palpation of the cervical spine.   -Recommended starting Naproxen  BID that is prescribed by her PCP for management of neck pain. Advised that when taking Naproxen  she CANNOT take other NSAIDs.  -Recommended Kenalog injection in office, this is declined. She prefers oral steroid -Medrol dose pack prescribed. Advised that increased caution is needed with diet and monitor blood sugars as steroids can increase blood sugars. She voices understanding. -Tizanidine  4mg  BID PRN pain. Advised medication can make her drowsy so do not take medication and then drive, and advised against taking other substances or medications, such as prescribed Pregabalin , at the same time of taking Tizanidine . She is  receptive. -Dizziness noted and patient advised if dizziness worsens including events of near syncope she should present to the ER for urgent evaluation -Return precautions advised and this includes if no improvement in pain or symptoms persist Orders:   methylPREDNISolone (MEDROL DOSEPAK) 4 MG TBPK tablet; Day 1: Take 8 mg (2 tablets) before breakfast, 4 mg (1 tablet) after lunch, 4 mg (1 tablet) after supper, and 8 mg (2 tablets) at bedtime. Day 2:Take 4 mg (1 tablet) before breakfast, 4 mg (1 tablet) after lunch, 4 mg (1 tablet) after supper, and 8 mg (2 tablets) at bedtime. Day 3: Take 4 mg (1 tablet) before breakfast, 4 mg (1 tablet) after lunch, 4 mg (1 tablet) after supper, and 4 mg (1 tablet) at bedtime. Day 4: Take 4 mg (1 tablet) before breakfast, 4 mg (1  tablet) after lunch, and 4 mg (1 tablet) at bedtime. Day 5: Take 4 mg (1 tablet) before breakfast and 4 mg (1 tablet) at bedtime. Day 6: Take 4 mg (1 tablet) before breakfast.   tiZANidine  (ZANAFLEX ) 4 MG tablet; Take 1 tablet (4 mg total) by mouth 2 (two) times daily as needed (pain). Medication may make you drowsy/sleepy      Return if symptoms worsen or fail to improve.  LAYMON LOISE CORE, FNP   "

## 2024-08-14 ENCOUNTER — Ambulatory Visit
